# Patient Record
Sex: Male | Born: 1960 | Race: Black or African American | Hispanic: No | Marital: Married | Smoking: Never smoker
Health system: Southern US, Community
[De-identification: ages and names within clinical notes are randomized; demographics above are authoritative.]

## PROBLEM LIST (undated history)

## (undated) DIAGNOSIS — D72819 Decreased white blood cell count, unspecified: Secondary | ICD-10-CM

## (undated) DIAGNOSIS — I10 Essential (primary) hypertension: Secondary | ICD-10-CM

## (undated) DIAGNOSIS — I2699 Other pulmonary embolism without acute cor pulmonale: Secondary | ICD-10-CM

## (undated) DIAGNOSIS — IMO0002 Reserved for concepts with insufficient information to code with codable children: Secondary | ICD-10-CM

## (undated) HISTORY — DX: Decreased white blood cell count, unspecified: D72.819

## (undated) HISTORY — DX: Reserved for concepts with insufficient information to code with codable children: IMO0002

## (undated) HISTORY — DX: Essential (primary) hypertension: I10

## (undated) HISTORY — PX: COLONOSCOPY: SHX174

## (undated) HISTORY — PX: NO PAST SURGERIES: SHX2092

---

## 2000-11-23 ENCOUNTER — Emergency Department (HOSPITAL_COMMUNITY): Admission: EM | Admit: 2000-11-23 | Discharge: 2000-11-23 | Payer: Self-pay | Admitting: Emergency Medicine

## 2003-09-11 ENCOUNTER — Inpatient Hospital Stay (HOSPITAL_COMMUNITY): Admission: EM | Admit: 2003-09-11 | Discharge: 2003-09-15 | Payer: Self-pay | Admitting: Emergency Medicine

## 2003-09-11 DIAGNOSIS — I2699 Other pulmonary embolism without acute cor pulmonale: Secondary | ICD-10-CM

## 2003-09-11 HISTORY — DX: Other pulmonary embolism without acute cor pulmonale: I26.99

## 2003-10-12 ENCOUNTER — Ambulatory Visit (HOSPITAL_COMMUNITY): Admission: RE | Admit: 2003-10-12 | Discharge: 2003-10-12 | Payer: Self-pay | Admitting: Family Medicine

## 2004-04-03 ENCOUNTER — Ambulatory Visit: Admission: RE | Admit: 2004-04-03 | Discharge: 2004-04-03 | Payer: Self-pay | Admitting: Family Medicine

## 2004-04-19 ENCOUNTER — Encounter: Admission: RE | Admit: 2004-04-19 | Discharge: 2004-04-19 | Payer: Self-pay | Admitting: General Surgery

## 2005-01-23 ENCOUNTER — Ambulatory Visit: Payer: Self-pay | Admitting: Internal Medicine

## 2005-02-28 ENCOUNTER — Encounter: Payer: Self-pay | Admitting: Internal Medicine

## 2005-02-28 ENCOUNTER — Encounter (INDEPENDENT_AMBULATORY_CARE_PROVIDER_SITE_OTHER): Payer: Self-pay | Admitting: *Deleted

## 2005-02-28 ENCOUNTER — Ambulatory Visit (HOSPITAL_COMMUNITY): Admission: RE | Admit: 2005-02-28 | Discharge: 2005-02-28 | Payer: Self-pay | Admitting: Internal Medicine

## 2005-02-28 ENCOUNTER — Ambulatory Visit: Payer: Self-pay | Admitting: Internal Medicine

## 2006-06-15 ENCOUNTER — Ambulatory Visit: Payer: Self-pay | Admitting: Family Medicine

## 2006-06-22 ENCOUNTER — Ambulatory Visit: Payer: Self-pay | Admitting: Family Medicine

## 2007-11-29 ENCOUNTER — Ambulatory Visit: Payer: Self-pay | Admitting: Family Medicine

## 2007-12-06 ENCOUNTER — Ambulatory Visit: Payer: Self-pay | Admitting: Family Medicine

## 2008-06-20 ENCOUNTER — Ambulatory Visit: Payer: Self-pay | Admitting: Family Medicine

## 2008-06-20 ENCOUNTER — Encounter: Admission: RE | Admit: 2008-06-20 | Discharge: 2008-06-20 | Payer: Self-pay | Admitting: Family Medicine

## 2008-07-02 ENCOUNTER — Encounter: Admission: RE | Admit: 2008-07-02 | Discharge: 2008-07-02 | Payer: Self-pay | Admitting: Family Medicine

## 2008-07-17 ENCOUNTER — Ambulatory Visit: Payer: Self-pay | Admitting: Family Medicine

## 2008-09-26 ENCOUNTER — Ambulatory Visit: Payer: Self-pay | Admitting: Family Medicine

## 2008-10-30 ENCOUNTER — Ambulatory Visit: Payer: Self-pay | Admitting: Family Medicine

## 2009-02-08 ENCOUNTER — Emergency Department (HOSPITAL_COMMUNITY): Admission: EM | Admit: 2009-02-08 | Discharge: 2009-02-08 | Payer: Self-pay | Admitting: Family Medicine

## 2009-07-04 ENCOUNTER — Ambulatory Visit: Payer: Self-pay | Admitting: Family Medicine

## 2009-10-01 ENCOUNTER — Ambulatory Visit: Payer: Self-pay | Admitting: Family Medicine

## 2009-10-04 ENCOUNTER — Ambulatory Visit: Payer: Self-pay | Admitting: Family Medicine

## 2009-10-08 ENCOUNTER — Ambulatory Visit: Payer: Self-pay | Admitting: Family Medicine

## 2010-06-08 ENCOUNTER — Encounter: Payer: Self-pay | Admitting: General Surgery

## 2010-07-22 ENCOUNTER — Encounter (INDEPENDENT_AMBULATORY_CARE_PROVIDER_SITE_OTHER): Payer: BC Managed Care – PPO | Admitting: Family Medicine

## 2010-07-22 DIAGNOSIS — I10 Essential (primary) hypertension: Secondary | ICD-10-CM

## 2010-07-22 DIAGNOSIS — Z79899 Other long term (current) drug therapy: Secondary | ICD-10-CM

## 2010-08-15 ENCOUNTER — Ambulatory Visit: Payer: Self-pay | Admitting: Family Medicine

## 2010-10-04 NOTE — H&P (Signed)
NAME:  BRYCEN, BEAN NO.:  [id number]   MEDICAL RECORD NO.:  [id number]                   PATIENT TYPE:  INP   LOCATION:  0162                                 FACILITY:  [facility]   PHYSICIAN:  Kela Millin, M.D.             DATE OF BIRTH:  November 26, 1960   DATE OF ADMISSION:  09/11/2003  DATE OF DISCHARGE:                                HISTORY & PHYSICAL   PRIMARY CARE PHYSICIAN:  Vikki Ports, M.D.   CHIEF COMPLAINT:  Persistent pleuritic chest pain and pulmonary embolus, per  CT scan.   HISTORY OF PRESENT ILLNESS:  The patient is a 50 year old black male who  presented with persistent pleuritic chest pain at the clinic on the day of  admission.  On further evaluation by Dr. Theresia Lo, a D-dimer was elevated,  and a CT scan revealed pulmonary embolus.  The patient was sent to the ER  for admission to the hospitalist service for further evaluation and  management.   The patient reports that five days ago, he went to the clinic with  complaints of fevers, chills, right-sided back pain, and sore throat.  Per  Dr. Theresia Lo, he was found to have strep throat.  Also, a chest x-ray done  at the time revealed a right lower lobe atelectasis or pneumonia.  The  patient was started on Biaxin.  On followup today, he continued to have a  pleuritic chest pain.  A D-dimer was done at the office, and it was  elevated.  The patient was sent for a CT scan at [facility], and it  revealed a pulmonary embolus and right basilar consolidation.  In the ER,  the patient reported pleuritic chest pain, worse with breathing, coughing,  sneezing, and as a result, he stated he tried to take shallow breaths to  minimize the pain.  He states that since starting antibiotics, the sore  throat and fevers resolved.  He denies a cough, palpitations, and dizziness.  The patient also denies any long distance travel as well as any surgeries.  He denies leg pain and swelling.   Also, no hemoptysis.  No hematemesis.  No  nausea or vomiting.  Also, no dysuria and no hematuria.   PAST MEDICAL HISTORY:  As above.  Otherwise negative.   MEDICATIONS:  1. Biaxin.  2. Hydrocodone/acetaminophen.   ALLERGIES:  NKDA.   SOCIAL HISTORY:  Denies tobacco.  Also denies alcohol and illegal drug use.  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   FAMILY HISTORY:  Mother has diabetes mellitus.   REVIEW OF SYSTEMS:  As per HPI.  Other comprehensive review of systems  negative.   PHYSICAL EXAMINATION:  VITAL SIGNS:  Temperature 97.1, blood pressure  155/98.  Pulse 93.  Respiratory rate 16.  O2 sats 100%.  GENERAL:  Patient is a middle-aged black male in no acute distress.  Well-  developed and well-nourished.  HEENT:  PERRL.  EOMI.  Sclerae are anicteric.  Moist mucous membranes.  No  oral exudates.  No plaques.  NECK:  Supple.  No adenopathy.  No thyromegaly.  No JVD.  LUNGS:  Decreased breath sounds in the right lung base with a few crackles.  No wheezes.  HEART:  Regular rate and rhythm.  Normal S1 and S2.  No S3 or S4  appreciated.  ABDOMEN:  Soft.  Bowel sounds present.  Nontender.  Nondistended.  No  organomegaly.  No masses palpable.  EXTREMITIES:  No clubbing, cyanosis or edema.  No calf tenderness.  No  Homans' sign.  NEUROLOGIC:  Alert and oriented x 3.  Cranial nerves II-XII grossly intact.  Strength 5/5.  Sensory grossly normal.  Nonfocal exam.   LABS/STUDIES:  CT chest:  Right posterior basal segmental pulmonary embolus  with right base consolidation at [facility].   D-dimer is 2.01.   ASSESSMENT/PLAN:  1. Pulmonary embolus:  Middle-aged black male with pulmonary embolus on CT     scan.  Will obtain hypercoagulable workup and start on Lovenox per the     pharmacy protocol and start Coumadin later.  Will also obtain lower     extremity Dopplers, follow, and further workup as clinically appropriate.  2.  Pneumonia:  Right lower lobe.  Will start empiric IV antibiotics, blood     cultures, chest x-rays, CBC, and CMET pending at this time.                                               Kela Millin, M.D.    ACV/MEDQ  D:  09/12/2003  T:  09/12/2003  Job:  [id number]

## 2010-10-04 NOTE — Discharge Summary (Signed)
NAME:  Cameron, LIFE NO.:  [id number]   MEDICAL RECORD NO.:  [id number]                   PATIENT TYPE:  INP   LOCATION:  0352                                 FACILITY:  [facility]   PHYSICIAN:  [name], MD                   DATE OF BIRTH:  [birth year]   DATE OF ADMISSION:  09/11/2003  DATE OF DISCHARGE:  09/15/2003                                 DISCHARGE SUMMARY   Cameron Schmitt is a 50 year old man who had presented to Cameron Schmitt,  M.D., on April 25 with complaints of persistent pleuritic chest discomfort.  Dr. Theresia Schmitt did a D-dimer assay and found it to be elevated.  The patient  was sent to [facility], where a CT scan of the chest was done, which  apparently confirmed the presence of a pulmonary embolus.  The patient  therefore sent to the [facility] emergency room for admission.  The patient  was seen in the emergency room by Dr. Suanne Schmitt.  In the emergency room he  complained of pleuritic chest discomfort, worse with taking a deep breath,  coughing, or sneezing.  He denied any recent history of travel or sedentary  state.  He had had no recent surgery.  He had had no recent leg pain or  swelling.  He denied hemoptysis hematemesis, nausea or vomiting.  He had  been taking Biaxin because of a previous diagnosis of bronchitis.   The patient's social history was remarkable in that he does not smoke, he  does not use alcohol or drugs.  He is very physically active, engaging in  karate and weightlifting on a daily basis.   PHYSICAL EXAMINATION:  GENERAL:  A gentleman who appeared to be in excellent  health.  VITAL SIGNS:  His temperature was 97.1, blood pressure was 155/98, pulse was  93, respirations 16, O2 saturation was 100%.  HEENT:  Within normal limits.  CHEST:  Said to show decreased breath sounds at the right base.  CARDIOVASCULAR:  Normal S1, S2, without rubs, murmurs, or gallops.  ABDOMEN:  Benign.  NEUROLOGIC:  Within normal  limits.  EXTREMITIES:  No evidence of cyanosis or edema.   Dr. Suanne Schmitt got a verbal report on the chest CT.  It was reported to show a  right posterior basal segment pulmonary embolus with right basal  consolidation consistent with atelectasis.  The patient's D-dimer was 2.01.  Therefore, the patient was admitted to [facility].  He was given  normal saline at 75 mL/hr.  Lovenox was begun 1 mg/kg subcu every 12 hours.  The patient was begun on Coumadin on the same day.  He was also placed on  Rocephin and Zithromax because of concern about possible underlying  bronchitis or pneumonia.  A hypercoagulability panel was obtained.  This was  entirely normal in regard to factor V Leiden levels, protein C, and protein  S.  A lower extremity Doppler ultrasound was obtained.  I do not have a  printed report on the result of this test.  By April 27 the patient was up  and ambulating.  Antibiotics were discontinued as it appeared clear from  review of x-rays that the patient's main problem was the pulmonary embolus.  He tolerated the Lovenox and Coumadin well.  Pleuritic chest pain resolved.  By April 29 the INR was up to 2.0.  It was felt reasonable to proceed with  discharge.   DISCHARGE DIAGNOSES:  1. Pulmonary embolus.  2. History of bronchitis.   On discharge the patient was advised to take Coumadin 7.5 mg Friday, 5 mg  Saturday, 5 mg Sunday, and then to return to Dr. Tenny Schmitt' office on Monday.  He  apparently already has an appointment.  At that time his prothrombin should  be repeated and he should be given additional advice about his Coumadin  therapy.  I instructed Mr. Leitz not to resume his karate and weightlifting  activities until he has seen Dr. Tenny Schmitt back on Monday and his general  condition had been assessed.  I encouraged him in general to engage in  regular physical exercise.   The patient's condition at the time of discharge was good.                                                [name], MD    SY/MEDQ  D:  09/15/2003  T:  09/15/2003  Job:  [id number]   cc:   C. Duane Lope, M.D.  [address]  [place]  [place] [address]  Fax: [phone]

## 2011-08-27 ENCOUNTER — Other Ambulatory Visit: Payer: Self-pay | Admitting: Family Medicine

## 2011-08-27 NOTE — Telephone Encounter (Signed)
Pt needs appt for further refills. 

## 2011-09-03 ENCOUNTER — Encounter: Payer: Self-pay | Admitting: Family Medicine

## 2011-09-03 ENCOUNTER — Ambulatory Visit (INDEPENDENT_AMBULATORY_CARE_PROVIDER_SITE_OTHER): Payer: BC Managed Care – PPO | Admitting: Family Medicine

## 2011-09-03 VITALS — BP 130/88 | HR 82 | Ht 68.0 in | Wt 150.0 lb

## 2011-09-03 DIAGNOSIS — D709 Neutropenia, unspecified: Secondary | ICD-10-CM | POA: Insufficient documentation

## 2011-09-03 DIAGNOSIS — Z0000 Encounter for general adult medical examination without abnormal findings: Secondary | ICD-10-CM

## 2011-09-03 DIAGNOSIS — I10 Essential (primary) hypertension: Secondary | ICD-10-CM

## 2011-09-03 LAB — CBC WITH DIFFERENTIAL/PLATELET
Basophils Absolute: 0 10*3/uL (ref 0.0–0.1)
Basophils Relative: 1 % (ref 0–1)
Eosinophils Relative: 3 % (ref 0–5)
HCT: 44.2 % (ref 39.0–52.0)
Hemoglobin: 14.3 g/dL (ref 13.0–17.0)
Lymphocytes Relative: 33 % (ref 12–46)
Lymphs Abs: 0.8 10*3/uL (ref 0.7–4.0)
MCH: 26.2 pg (ref 26.0–34.0)
MCHC: 32.4 g/dL (ref 30.0–36.0)
MCV: 81 fL (ref 78.0–100.0)
Monocytes Absolute: 0.3 10*3/uL (ref 0.1–1.0)
Monocytes Relative: 14 % — ABNORMAL HIGH (ref 3–12)
Neutro Abs: 1.1 10*3/uL — ABNORMAL LOW (ref 1.7–7.7)
Neutrophils Relative %: 49 % (ref 43–77)
Platelets: 267 10*3/uL (ref 150–400)
RBC: 5.46 MIL/uL (ref 4.22–5.81)
RDW: 13.7 % (ref 11.5–15.5)
WBC: 2.3 10*3/uL — ABNORMAL LOW (ref 4.0–10.5)

## 2011-09-03 LAB — COMPREHENSIVE METABOLIC PANEL
ALT: 185 U/L — ABNORMAL HIGH (ref 0–53)
AST: 303 U/L — ABNORMAL HIGH (ref 0–37)
Albumin: 4.5 g/dL (ref 3.5–5.2)
BUN: 18 mg/dL (ref 6–23)
CO2: 32 mEq/L (ref 19–32)
Calcium: 9.8 mg/dL (ref 8.4–10.5)
Chloride: 95 mEq/L — ABNORMAL LOW (ref 96–112)
Creat: 1.01 mg/dL (ref 0.50–1.35)
Potassium: 4 mEq/L (ref 3.5–5.3)
Sodium: 135 mEq/L (ref 135–145)
Total Protein: 7.9 g/dL (ref 6.0–8.3)

## 2011-09-03 LAB — LIPID PANEL
Cholesterol: 200 mg/dL (ref 0–200)
HDL: 41 mg/dL (ref >39)
LDL Cholesterol: 149 mg/dL — ABNORMAL HIGH (ref 0–99)
Triglycerides: 49 mg/dL (ref <150)

## 2011-09-03 MED ORDER — LISINOPRIL-HYDROCHLOROTHIAZIDE 20-12.5 MG PO TABS: 2.0000 | ORAL_TABLET | Freq: Every day | ORAL | Status: DC

## 2011-09-03 MED ORDER — AMLODIPINE BESYLATE 5 MG PO TABS: 5.0000 mg | ORAL_TABLET | Freq: Every day | ORAL | Status: DC

## 2011-09-03 NOTE — Progress Notes (Signed)
  Subjective:    Patient ID: Cameron Schmitt, male    DOB: 05-29-1960, 51 y.o.   MRN: [id number]  HPI He is here for a complete examination. He has no particular concerns or complaints. He continues on his blood pressure medications. He exercises regularly. He does have a previous history of neutropenia. His work is going well. He is dating someone at this time. He does have 2 children both in their early 45s. There is a questionable family history of cancer with a brother dying before age 94 however he is not sure exactly what kind of cancer this was. He exercises regularly with weights and aerobics.   Review of Systems  Constitutional: Negative.   HENT: Negative.   Eyes: Negative.   Respiratory: Negative.   Cardiovascular: Negative.   Gastrointestinal: Negative.   Genitourinary: Negative.   Musculoskeletal: Negative.   Skin: Negative.   Neurological: Negative.   Hematological: Negative.   Psychiatric/Behavioral: Negative.        Objective:   Physical Exam BP 130/88  Pulse 82  Ht 5\' 8"  (1.727 m)  Wt 150 lb (68.04 kg)  BMI 22.81 kg/m2  General Appearance:    Alert, cooperative, no distress, appears stated age  Head:    Normocephalic, without obvious abnormality, atraumatic  Eyes:    PERRL, conjunctiva/corneas clear, EOM's intact, fundi    benign  Ears:    Normal TM's and external ear canals  Nose:   Nares normal, mucosa normal, no drainage or sinus   tenderness  Throat:   Lips, mucosa, and tongue normal; teeth and gums normal  Neck:   Supple, no lymphadenopathy;  thyroid:  no   enlargement/tenderness/nodules; no carotid   bruit or JVD  Back:    Spine nontender, no curvature, ROM normal, no CVA     tenderness  Lungs:     Clear to auscultation bilaterally without wheezes, rales or     ronchi; respirations unlabored  Chest Wall:    No tenderness or deformity   Heart:    Regular rate and rhythm, S1 and S2 normal, no murmur, rub   or gallop  Breast Exam:    No chest wall  tenderness, masses or gynecomastia  Abdomen:     Soft, non-tender, nondistended, normoactive bowel sounds,    no masses, no hepatosplenomegaly  Genitalia:   deferred   Rectal:   deferred  Extremities:   No clubbing, cyanosis or edema  Pulses:   2+ and symmetric all extremities  Skin:   Skin color, texture, turgor normal, no rashes or lesions  Lymph nodes:   Cervical, supraclavicular, and axillary nodes normal  Neurologic:   CNII-XII intact, normal strength, sensation and gait; reflexes 2+ and symmetric throughout          Psych:   Normal mood, affect, hygiene and grooming.           Assessment & Plan:   1. Routine general medical examination at a health care facility  HM COLONOSCOPY, PSA, CBC with Differential, Comprehensive metabolic panel, Lipid panel  2. Neutropenia  CBC with Differential  3. Hypertension  amLODipine (NORVASC) 5 MG tablet, lisinopril-hydrochlorothiazide (ZESTORETIC) 20-12.5 MG per tablet  Encouraged him to continue with his very active lifestyle.

## 2011-09-04 LAB — PSA: PSA: 1.35 ng/mL (ref <=4.00)

## 2011-09-04 NOTE — Progress Notes (Signed)
Pt coming in tomorrow for lab consult

## 2011-09-05 ENCOUNTER — Ambulatory Visit (INDEPENDENT_AMBULATORY_CARE_PROVIDER_SITE_OTHER): Payer: BC Managed Care – PPO | Admitting: Family Medicine

## 2011-09-05 DIAGNOSIS — K759 Inflammatory liver disease, unspecified: Secondary | ICD-10-CM

## 2011-09-05 LAB — HEPATITIS C ANTIBODY: HCV Ab: NEGATIVE

## 2011-09-05 LAB — HEPATITIS B SURFACE ANTIBODY,QUALITATIVE: Hep B S Ab: NEGATIVE

## 2011-09-05 NOTE — Progress Notes (Signed)
  Subjective:    Patient ID: Cameron Schmitt, male    DOB: June 21, 1960, 51 y.o.   MRN: [id number]  HPI He is here for recheck. Recent blood work did show elevated liver enzymes. He has not been sick recently. He does take multivitamin supplements as well as protein supplements. He does not do street drugs.  Review of Systems     Objective:   Physical Exam Alert and in no distress otherwise not examined       Assessment & Plan:   1. Hepatitis  Hepatitis A antibody, total, Hepatitis B surface antibody, Hepatitis C antibody   I will have him stop the multivitamin supplements and protein supplements indicating to him that this might or might not be one of the issues. He is also to avoid all alcohol.

## 2011-09-06 LAB — HEPATITIS A ANTIBODY, TOTAL: Hep A Total Ab: NEGATIVE

## 2011-09-08 ENCOUNTER — Other Ambulatory Visit: Payer: BC Managed Care – PPO

## 2011-09-08 ENCOUNTER — Other Ambulatory Visit: Payer: Self-pay

## 2011-09-08 DIAGNOSIS — R748 Abnormal levels of other serum enzymes: Secondary | ICD-10-CM

## 2011-09-11 ENCOUNTER — Other Ambulatory Visit: Payer: BC Managed Care – PPO

## 2011-09-11 DIAGNOSIS — R748 Abnormal levels of other serum enzymes: Secondary | ICD-10-CM

## 2011-09-11 LAB — COMPREHENSIVE METABOLIC PANEL
ALT: 57 U/L — ABNORMAL HIGH (ref 0–53)
AST: 43 U/L — ABNORMAL HIGH (ref 0–37)
Albumin: 4.6 g/dL (ref 3.5–5.2)
BUN: 20 mg/dL (ref 6–23)
CO2: 29 mEq/L (ref 19–32)
Calcium: 9.1 mg/dL (ref 8.4–10.5)
Chloride: 98 mEq/L (ref 96–112)
Creat: 1.27 mg/dL (ref 0.50–1.35)
Glucose, Bld: 109 mg/dL — ABNORMAL HIGH (ref 70–99)
Potassium: 3.5 mEq/L (ref 3.5–5.3)
Sodium: 136 mEq/L (ref 135–145)
Total Bilirubin: 0.6 mg/dL (ref 0.3–1.2)
Total Protein: 7.4 g/dL (ref 6.0–8.3)

## 2011-10-09 LAB — HM COLONOSCOPY: HM Colonoscopy: NORMAL

## 2011-10-14 ENCOUNTER — Encounter: Payer: Self-pay | Admitting: Internal Medicine

## 2012-09-14 ENCOUNTER — Other Ambulatory Visit: Payer: Self-pay | Admitting: Family Medicine

## 2012-09-17 ENCOUNTER — Other Ambulatory Visit: Payer: Self-pay

## 2012-09-17 MED ORDER — LISINOPRIL-HYDROCHLOROTHIAZIDE 20-12.5 MG PO TABS: ORAL_TABLET | ORAL | Status: DC

## 2012-09-17 MED ORDER — AMLODIPINE BESYLATE 5 MG PO TABS: ORAL_TABLET | ORAL | Status: DC

## 2012-09-17 NOTE — Telephone Encounter (Signed)
PT WANTED MED SENT TO [facility] NOT [facility]

## 2012-09-30 ENCOUNTER — Encounter: Payer: Self-pay | Admitting: Internal Medicine

## 2012-10-15 ENCOUNTER — Encounter: Payer: Self-pay | Admitting: Family Medicine

## 2012-10-15 ENCOUNTER — Ambulatory Visit (INDEPENDENT_AMBULATORY_CARE_PROVIDER_SITE_OTHER): Payer: 59 | Admitting: Family Medicine

## 2012-10-15 VITALS — BP 126/84 | HR 82 | Ht 67.5 in | Wt 149.0 lb

## 2012-10-15 DIAGNOSIS — D709 Neutropenia, unspecified: Secondary | ICD-10-CM

## 2012-10-15 DIAGNOSIS — I10 Essential (primary) hypertension: Secondary | ICD-10-CM

## 2012-10-15 DIAGNOSIS — Z0000 Encounter for general adult medical examination without abnormal findings: Secondary | ICD-10-CM

## 2012-10-15 DIAGNOSIS — M25569 Pain in unspecified knee: Secondary | ICD-10-CM

## 2012-10-15 DIAGNOSIS — M25561 Pain in right knee: Secondary | ICD-10-CM

## 2012-10-15 DIAGNOSIS — Z79899 Other long term (current) drug therapy: Secondary | ICD-10-CM

## 2012-10-15 LAB — CBC WITH DIFFERENTIAL/PLATELET
Basophils Absolute: 0 10*3/uL (ref 0.0–0.1)
Eosinophils Absolute: 0 10*3/uL (ref 0.0–0.7)
Hemoglobin: 14.4 g/dL (ref 13.0–17.0)
Lymphocytes Relative: 22 % (ref 12–46)
Lymphs Abs: 0.7 10*3/uL (ref 0.7–4.0)
MCH: 26.2 pg (ref 26.0–34.0)
MCHC: 33.1 g/dL (ref 30.0–36.0)
MCV: 79.1 fL (ref 78.0–100.0)
Monocytes Absolute: 0.4 10*3/uL (ref 0.1–1.0)
Monocytes Relative: 11 % (ref 3–12)
Neutro Abs: 2.3 10*3/uL (ref 1.7–7.7)
Neutrophils Relative %: 66 % (ref 43–77)
Platelets: 243 10*3/uL (ref 150–400)
RBC: 5.5 MIL/uL (ref 4.22–5.81)
WBC: 3.4 10*3/uL — ABNORMAL LOW (ref 4.0–10.5)

## 2012-10-15 LAB — POCT URINALYSIS DIPSTICK
Bilirubin, UA: NEGATIVE
Glucose, UA: NEGATIVE
Ketones, UA: NEGATIVE
Leukocytes, UA: NEGATIVE
Nitrite, UA: NEGATIVE
Spec Grav, UA: 1.01
Urobilinogen, UA: NEGATIVE

## 2012-10-15 MED ORDER — LISINOPRIL-HYDROCHLOROTHIAZIDE 20-12.5 MG PO TABS: ORAL_TABLET | ORAL | Status: DC

## 2012-10-15 MED ORDER — AMLODIPINE BESYLATE 5 MG PO TABS: ORAL_TABLET | ORAL | Status: DC

## 2012-10-15 NOTE — Patient Instructions (Signed)
If you're knee pain gives you more trouble, return here for further evaluation.

## 2012-10-15 NOTE — Progress Notes (Signed)
  Subjective:    Patient ID: Cameron Schmitt, male    DOB: 09/10/1960, 52 y.o.   MRN: [id number]  HPI He is here for complete examination. He does complain of some right knee discomfort. He states that he can squat without difficulty but occasionally when he walks he will have pain which makes him feel weak in that leg. No popping, locking or grinding. No history of injury.   Review of Systems  Constitutional: Negative.   HENT: Negative.   Eyes: Negative.   Respiratory: Negative.   Cardiovascular: Negative.   Gastrointestinal: Negative.   Endocrine: Negative.   Genitourinary: Negative.   Allergic/Immunologic: Negative.   Neurological: Negative.   Hematological: Negative.   Psychiatric/Behavioral: Negative.        Objective:   Physical Exam BP 126/84  Pulse 82  Ht 5' 7.5" (1.715 m)  Wt 149 lb (67.586 kg)  BMI 22.98 kg/m2  General Appearance:    Alert, cooperative, no distress, appears stated age  Head:    Normocephalic, without obvious abnormality, atraumatic  Eyes:    PERRL, conjunctiva/corneas clear, EOM's intact.  Ears:    Normal TM's and external ear canals  Nose:   Nares normal, mucosa normal, no drainage or sinus   tenderness  Throat:   Lips, mucosa, and tongue normal; teeth and gums normal  Neck:   Supple, no lymphadenopathy;  thyroid:  no   enlargement/tenderness/nodules; no carotid   bruit or JVD  Back:    Spine nontender, no curvature, ROM normal, no CVA     tenderness  Lungs:     Clear to auscultation bilaterally without wheezes, rales or     ronchi; respirations unlabored  Chest Wall:    No tenderness or deformity   Heart:    Regular rate and rhythm, S1 and S2 normal, no murmur, rub   or gallop  Breast Exam:    No chest wall tenderness, masses or gynecomastia  Abdomen:     Soft, non-tender, nondistended, normoactive bowel sounds,    no masses, no hepatosplenomegaly  Genitalia:    Normal male external genitalia without lesions.  Testicles without masses.  No  inguinal hernias.  Rectal:    Normal sphincter tone, no masses or tenderness; guaiac negative stool.  Prostate smooth, no nodules, not enlarged.  Extremities:   No clubbing, cyanosis or edema.left knee shows no effusion, palpable tenderness. Ligaments intact. Negative anterior drawer and McMurray's testing.  Pulses:   2+ and symmetric all extremities  Skin:   Skin color, texture, turgor normal, no rashes or lesions  Lymph nodes:   Cervical, supraclavicular, and axillary nodes normal  Neurologic:   CNII-XII intact, normal strength, sensation and gait; reflexes 2+ and symmetric throughout          Psych:   Normal mood, affect, hygiene and grooming.          Assessment & Plan:  Routine general medical examination at a health care facility - Plan: CBC with Differential, Comprehensive metabolic panel, Lipid panel  Hypertension - Plan: POCT urinalysis dipstick, CBC with Differential, Comprehensive metabolic panel, lisinopril-hydrochlorothiazide (PRINZIDE,ZESTORETIC) 20-12.5 MG per tablet, amLODipine (NORVASC) 5 MG tablet  Neutropenia - Plan: CBC with Differential  Right knee pain  Encounter for long-term (current) use of other medications  Instructed to return if the knee pain causesmore trouble

## 2012-10-16 LAB — COMPREHENSIVE METABOLIC PANEL
ALT: 30 U/L (ref 0–53)
AST: 33 U/L (ref 0–37)
Albumin: 4.6 g/dL (ref 3.5–5.2)
Alkaline Phosphatase: 59 U/L (ref 39–117)
BUN: 13 mg/dL (ref 6–23)
CO2: 26 mEq/L (ref 19–32)
Creat: 1.01 mg/dL (ref 0.50–1.35)
Glucose, Bld: 77 mg/dL (ref 70–99)
Sodium: 138 mEq/L (ref 135–145)
Total Bilirubin: 0.9 mg/dL (ref 0.3–1.2)
Total Protein: 8.1 g/dL (ref 6.0–8.3)

## 2012-10-16 LAB — LIPID PANEL
Cholesterol: 178 mg/dL (ref 0–200)
HDL: 45 mg/dL (ref >39)
LDL Cholesterol: 123 mg/dL — ABNORMAL HIGH (ref 0–99)
Total CHOL/HDL Ratio: 4 Ratio
Triglycerides: 50 mg/dL (ref <150)
VLDL: 10 mg/dL (ref 0–40)

## 2012-10-16 NOTE — Progress Notes (Signed)
Quick Note:  The blood work is normal ______ 

## 2012-10-18 NOTE — Progress Notes (Signed)
Quick Note:  CALLED PT CELL/HOME # LEFT MESSAGE FOR PT WORD FOR WORD The blood work is normal ______

## 2013-01-04 ENCOUNTER — Encounter: Payer: Self-pay | Admitting: Family Medicine

## 2013-01-04 ENCOUNTER — Ambulatory Visit (INDEPENDENT_AMBULATORY_CARE_PROVIDER_SITE_OTHER): Payer: 59 | Admitting: Family Medicine

## 2013-01-04 VITALS — BP 120/78 | HR 78 | Wt 150.0 lb

## 2013-01-04 DIAGNOSIS — M5090 Cervical disc disorder, unspecified, unspecified cervical region: Secondary | ICD-10-CM

## 2013-01-04 NOTE — Progress Notes (Signed)
  Subjective:    Patient ID: Cameron Schmitt, male    DOB: 1961/03/23, 52 y.o.   MRN: [id number]  HPI Approximately 3 weeks ago he noted the onset of right-sided neck pain especially when he would extend his head. He would get tingling in the neck shoulder and into the arms down into the thumb index and large fingers. No history of recent injury. Review of the record indicates he did have a similar problem 2010. The MRI did show some small disc herniations. Review of Systems     Objective:   Physical Exam Compression of the head in an axial load did cause pain with tingling into his arms. Flexion in the right lateral direction also calls tingling sensation in the thumb and index finger. Motor ,sensory and DTRs are normal.       Assessment & Plan:  Cervical neck pain with evidence of disc disease - Plan: MR Cervical Spine Wo Contrast

## 2013-01-08 ENCOUNTER — Ambulatory Visit
Admission: RE | Admit: 2013-01-08 | Discharge: 2013-01-08 | Disposition: A | Discharge status: Home / Self Care | Payer: 59 | Source: Ambulatory Visit | Attending: Family Medicine | Admitting: Family Medicine

## 2013-01-08 DIAGNOSIS — M5090 Cervical disc disorder, unspecified, unspecified cervical region: Secondary | ICD-10-CM

## 2013-01-12 ENCOUNTER — Telehealth: Payer: Self-pay | Admitting: Family Medicine

## 2013-01-12 NOTE — Telephone Encounter (Signed)
MRI shows findings of disc disease that could be contributing to his pain, but the MRI doesn't seem to be a lot different than findings in 2010.  Not sure what he and Dr. Susann Givens discussed as next steps, but he can either wait til Dr. Susann Givens gets back next week to discuss options, or one option may be to consider referral for epidural steroid injection in the C spine if his pain and symptoms are severe.

## 2013-01-13 ENCOUNTER — Telehealth: Payer: Self-pay | Admitting: *Deleted

## 2013-01-13 NOTE — Telephone Encounter (Signed)
Spoke with patient and he would like to wait until Dr.Lalonde gets back and reviews MRI results and decides what next step is. I told him someone would be in touch early next week with Dr.Lalonde's recommendations.

## 2013-01-13 NOTE — Telephone Encounter (Signed)
See msg

## 2013-01-13 NOTE — Telephone Encounter (Signed)
Left message for patient to return my call.

## 2013-01-18 ENCOUNTER — Other Ambulatory Visit: Payer: Self-pay

## 2013-01-18 DIAGNOSIS — M549 Dorsalgia, unspecified: Secondary | ICD-10-CM

## 2013-01-18 NOTE — Telephone Encounter (Signed)
I left a message on his cell phone telling him on like to refer him to someone for consideration of epidural. Refer him to Dr. Ethelene Hal.

## 2013-01-18 NOTE — Progress Notes (Signed)
Quick Note:  LEFT MESSAGE FOR PT TO CALL ME BACK ______

## 2013-01-18 NOTE — Progress Notes (Signed)
Quick Note:  PT HAS BEEN INFORMED OF CHANGE OF PLANS TO SEND HIM TO [facility] PT VERBALIZED UNDERSTANDING ______

## 2013-03-24 ENCOUNTER — Other Ambulatory Visit: Payer: Self-pay

## 2013-11-16 ENCOUNTER — Other Ambulatory Visit: Payer: Self-pay | Admitting: Family Medicine

## 2013-11-16 NOTE — Telephone Encounter (Signed)
IS THIS OKAY 

## 2013-11-16 NOTE — Telephone Encounter (Signed)
I renewed his medication however he needs to set up an appointment for sometime in August

## 2014-02-07 ENCOUNTER — Other Ambulatory Visit: Payer: Self-pay | Admitting: Family Medicine

## 2014-10-23 ENCOUNTER — Emergency Department (HOSPITAL_COMMUNITY)
Admission: EM | Admit: 2014-10-23 | Discharge: 2014-10-24 | Disposition: A | Discharge status: Home / Self Care | Payer: PRIVATE HEALTH INSURANCE | Attending: Emergency Medicine | Admitting: Emergency Medicine

## 2014-10-23 ENCOUNTER — Encounter (HOSPITAL_COMMUNITY): Payer: Self-pay | Admitting: Emergency Medicine

## 2014-10-23 DIAGNOSIS — S39012A Strain of muscle, fascia and tendon of lower back, initial encounter: Secondary | ICD-10-CM | POA: Diagnosis not present

## 2014-10-23 DIAGNOSIS — Z79899 Other long term (current) drug therapy: Secondary | ICD-10-CM | POA: Diagnosis not present

## 2014-10-23 DIAGNOSIS — Y999 Unspecified external cause status: Secondary | ICD-10-CM | POA: Insufficient documentation

## 2014-10-23 DIAGNOSIS — Z872 Personal history of diseases of the skin and subcutaneous tissue: Secondary | ICD-10-CM | POA: Insufficient documentation

## 2014-10-23 DIAGNOSIS — Y929 Unspecified place or not applicable: Secondary | ICD-10-CM | POA: Insufficient documentation

## 2014-10-23 DIAGNOSIS — I10 Essential (primary) hypertension: Secondary | ICD-10-CM | POA: Diagnosis not present

## 2014-10-23 DIAGNOSIS — Y939 Activity, unspecified: Secondary | ICD-10-CM | POA: Insufficient documentation

## 2014-10-23 DIAGNOSIS — X58XXXA Exposure to other specified factors, initial encounter: Secondary | ICD-10-CM | POA: Insufficient documentation

## 2014-10-23 DIAGNOSIS — Z862 Personal history of diseases of the blood and blood-forming organs and certain disorders involving the immune mechanism: Secondary | ICD-10-CM | POA: Diagnosis not present

## 2014-10-23 DIAGNOSIS — S3992XA Unspecified injury of lower back, initial encounter: Secondary | ICD-10-CM | POA: Diagnosis present

## 2014-10-23 LAB — URINALYSIS, ROUTINE W REFLEX MICROSCOPIC
Bilirubin Urine: NEGATIVE
Glucose, UA: NEGATIVE mg/dL
Hgb urine dipstick: NEGATIVE
Ketones, ur: NEGATIVE mg/dL
LEUKOCYTES UA: NEGATIVE
NITRITE: NEGATIVE
PH: 7 (ref 5.0–8.0)
PROTEIN: NEGATIVE mg/dL
Specific Gravity, Urine: 1.027 (ref 1.005–1.030)
Urobilinogen, UA: 1 mg/dL (ref 0.0–1.0)

## 2014-10-23 NOTE — ED Provider Notes (Signed)
CSN: [id number]     Arrival date & time 10/23/14  2214 History  This chart was scribed for non-physician provider Jaynie Crumble, PA-C, working with Shon Baton, MD by Phillis Haggis, ED Scribe. This patient was seen in room [facility] and patient care was started at 11:55 PM.   Chief Complaint  Patient presents with  . Back Pain   The history is provided by the patient. No language interpreter was used.    HPI Comments: Cameron Schmitt is a 54 y.o. male who presents to the Emergency Department complaining of non-radiating constant, waxing and waning burning left lower back pain onset 2 weeks ago. He states that he does not know of any recent injury, states that he woke up with the pain. Pt reports taking Advil, Icy-Hot, and warm compresses for the pain with some relief before, but states that it is no longer working. Pt reports pain increases with twisting movements, walking and laying on his right side. Pt states that he does lifting at work. He denies numbness, weakness, nausea, vomiting, dizziness, headaches, diarrhea, constipation, or urinary symptoms. He denies having a PCP.   Past Medical History  Diagnosis Date  . Hypertension   . Inguinal cyst   . Leukopenia    History reviewed. No pertinent past surgical history. Family History  Problem Relation Age of Onset  . Diabetes Mother   . Hyperlipidemia Mother   . Hypertension Mother   . COPD Father    History  Substance Use Topics  . Smoking status: Never Smoker   . Smokeless tobacco: Never Used  . Alcohol Use: 0.6 oz/week    1 Cans of beer per week    Review of Systems  Constitutional: Negative for fever and chills.  Gastrointestinal: Negative for nausea, vomiting, diarrhea and constipation.  Genitourinary: Negative for dysuria, urgency and frequency.  Musculoskeletal: Positive for back pain.  Neurological: Negative for dizziness, weakness, numbness and headaches.   Allergies  Review of patient's allergies  indicates no known allergies.  Home Medications   Prior to Admission medications   Medication Sig Start Date End Date Taking? Authorizing Provider  amLODipine (NORVASC) 5 MG tablet take 1 tablet by mouth once daily 02/07/14   Ronnald Nian, MD  lisinopril-hydrochlorothiazide [facility]) 20-12.5 MG per tablet take 2 tablets by mouth once daily 02/07/14   Ronnald Nian, MD   BP 145/91 mmHg  Pulse 76  Temp(Src) 98.1 F (36.7 C) (Oral)  Resp 16  Ht  (1.727 m)  Wt 160 lb (72.576 kg)  BMI 24.33 kg/m2  SpO2 98%   Physical Exam  Constitutional: He is oriented to person, place, and time. He appears well-developed and well-nourished.  HENT:  Head: Normocephalic and atraumatic.  Eyes: EOM are normal.  Neck: Normal range of motion. Neck supple.  Cardiovascular: Normal rate.   Pulmonary/Chest: Effort normal.  Abdominal: Soft. Bowel sounds are normal. There is no tenderness.  Musculoskeletal: Normal range of motion.  Thoracic and lumbar spine nontender to palpation in midline. Tender to palpation over left paraspinal muscles around L4 level. Pain with forward flexion, twisting to the left. No pain with bilateral straight leg raise.  Neurological: He is alert and oriented to person, place, and time. No cranial nerve deficit. Coordination normal.  5/5 and equal lower extremity strength. 2+ and equal patellar reflexes bilaterally. Pt able to dorsiflex bilateral toes and feet with good strength against resistance. Equal sensation bilaterally over thighs and lower legs.    Skin: Skin  is warm and dry.  Psychiatric: He has a normal mood and affect. His behavior is normal.  Nursing note and vitals reviewed.   ED Course  Procedures (including critical care time) DIAGNOSTIC STUDIES: Oxygen Saturation is 98% on room air, normal by my interpretation.    COORDINATION OF CARE: 11:59 PM-Discussed treatment plan which includes muscle relaxants, back exercises, anti-inflammatories, pain  medication, heating pad, and no strenuous activity with pt at bedside and pt agreed to plan; told to follow up if necessary.   Labs Review Labs Reviewed  URINALYSIS, ROUTINE W REFLEX MICROSCOPIC [facility])   Imaging Review No results found.   EKG Interpretation None      MDM   Final diagnoses:  Lumbar strain, initial encounter     patient with nontraumatic pain in the left lower back. It does not radiate. No fever. No evidence of cauda equina. Urinalysis negative. Pain is not consistent with possible kidney stone, patient is comfortable appearing. Most likely muscular spasms versus strain. Pain is worsened with movement and palpation. Will start on Flexeril, tramadol for severe pain, naproxen. Follow up with primary care doctor.   Filed Vitals:   10/23/14 2243 10/24/14 0039  BP: 145/91 134/87  Pulse: 76 80  Temp: 98.1 F (36.7 C) 97.9 F (36.6 C)  TempSrc: Oral Oral  Resp: 16 18  Height: 5\' 8"  (1.727 m)   Weight: 160 lb (72.576 kg)   SpO2: 98% 100%  I personally performed the services described in this documentation, which was scribed in my presence. The recorded information has been reviewed and is accurate.     Jaynie Crumbleatyana Lya Holben, PA-C 10/24/14 0040  Shon Batonourtney F Horton, MD 10/24/14 [phone]

## 2014-10-23 NOTE — ED Notes (Signed)
Pt  C/o pain in left lower back, onset approx 4 days ago,  No known injury.  Pt does lifting at work

## 2014-10-24 MED ORDER — TRAMADOL HCL 50 MG PO TABS: 50.0000 mg | ORAL_TABLET | Freq: Four times a day (QID) | ORAL | Status: DC | PRN

## 2014-10-24 MED ORDER — CYCLOBENZAPRINE HCL 10 MG PO TABS: 10.0000 mg | ORAL_TABLET | Freq: Two times a day (BID) | ORAL | Status: DC | PRN

## 2014-10-24 MED ORDER — CYCLOBENZAPRINE HCL 10 MG PO TABS
10.0000 mg | ORAL_TABLET | Freq: Once | ORAL | Status: AC
  Administered 2014-10-24: 10 mg via ORAL
  Filled 2014-10-24: qty 1

## 2014-10-24 MED ORDER — NAPROXEN 500 MG PO TABS: 500.0000 mg | ORAL_TABLET | Freq: Two times a day (BID) | ORAL | Status: DC

## 2014-10-24 MED ORDER — OXYCODONE-ACETAMINOPHEN 5-325 MG PO TABS
1.0000 | ORAL_TABLET | Freq: Once | ORAL | Status: AC
  Administered 2014-10-24: 1 via ORAL
  Filled 2014-10-24: qty 1

## 2014-10-24 NOTE — Discharge Instructions (Signed)
Naproxen for pain and inflammation daily. Tramadol for severe pain. Flexeril for muscle spasms. Avoid any heavy lifting. Try heating pads, stretches, see exercise given below. Follow-up with primary care doctor.    Back Pain, Adult Low back pain is very common. About 1 in 5 people have back pain.The cause of low back pain is rarely dangerous. The pain often gets better over time.About half of people with a sudden onset of back pain feel better in just 2 weeks. About 8 in 10 people feel better by 6 weeks.  CAUSES Some common causes of back pain include:  Strain of the muscles or ligaments supporting the spine.  Wear and tear (degeneration) of the spinal discs.  Arthritis.  Direct injury to the back. DIAGNOSIS Most of the time, the direct cause of low back pain is not known.However, back pain can be treated effectively even when the exact cause of the pain is unknown.Answering your caregiver's questions about your overall health and symptoms is one of the most accurate ways to make sure the cause of your pain is not dangerous. If your caregiver needs more information, he or she may order lab work or imaging tests (X-rays or MRIs).However, even if imaging tests show changes in your back, this usually does not require surgery. HOME CARE INSTRUCTIONS For many people, back pain returns.Since low back pain is rarely dangerous, it is often a condition that people can learn to [facility] their own.   Remain active. It is stressful on the back to sit or stand in one place. Do not sit, drive, or stand in one place for more than 30 minutes at a time. Take short walks on level surfaces as soon as pain allows.Try to increase the length of time you walk each day.  Do not stay in bed.Resting more than 1 or 2 days can delay your recovery.  Do not avoid exercise or work.Your body is made to move.It is not dangerous to be active, even though your back may hurt.Your back will likely heal faster if you  return to being active before your pain is gone.  Pay attention to your body when you bend and lift. Many people have less discomfortwhen lifting if they bend their knees, keep the load close to their bodies,and avoid twisting. Often, the most comfortable positions are those that put less stress on your recovering back.  Find a comfortable position to sleep. Use a firm mattress and lie on your side with your knees slightly bent. If you lie on your back, put a pillow under your knees.  Only take over-the-counter or prescription medicines as directed by your caregiver. Over-the-counter medicines to reduce pain and inflammation are often the most helpful.Your caregiver may prescribe muscle relaxant drugs.These medicines help dull your pain so you can more quickly return to your normal activities and healthy exercise.  Put ice on the injured area.  Put ice in a plastic bag.  Place a towel between your skin and the bag.  Leave the ice on for 15-20 minutes, 03-04 times a day for the first 2 to 3 days. After that, ice and heat may be alternated to reduce pain and spasms.  Ask your caregiver about trying back exercises and gentle massage. This may be of some benefit.  Avoid feeling anxious or stressed.Stress increases muscle tension and can worsen back pain.It is important to recognize when you are anxious or stressed and learn ways to manage it.Exercise is a great option. SEEK MEDICAL CARE IF:  You  have pain that is not relieved with rest or medicine.  You have pain that does not improve in 1 week.  You have new symptoms.  You are generally not feeling well. SEEK IMMEDIATE MEDICAL CARE IF:   You have pain that radiates from your back into your legs.  You develop new bowel or bladder control problems.  You have unusual weakness or numbness in your arms or legs.  You develop nausea or vomiting.  You develop abdominal pain.  You feel faint. Document Released: 05/05/2005  Document Revised: 11/04/2011 Document Reviewed: 09/06/2013 [facility] Patient Information 2015 [place], [place]. This information is not intended to replace advice given to you by your health care provider. Make sure you discuss any questions you have with your health care provider.   Lumbosacral Strain Lumbosacral strain is a strain of any of the parts that make up your lumbosacral vertebrae. Your lumbosacral vertebrae are the bones that make up the lower third of your backbone. Your lumbosacral vertebrae are held together by muscles and tough, fibrous tissue (ligaments).  CAUSES  A sudden blow to your back can cause lumbosacral strain. Also, anything that causes an excessive stretch of the muscles in the low back can cause this strain. This is typically seen when people exert themselves strenuously, fall, lift heavy objects, bend, or crouch repeatedly. RISK FACTORS  Physically demanding work.  Participation in pushing or pulling sports or sports that require a sudden twist of the back (tennis, golf, baseball).  Weight lifting.  Excessive lower back curvature.  Forward-tilted pelvis.  Weak back or abdominal muscles or both.  Tight hamstrings. SIGNS AND SYMPTOMS  Lumbosacral strain may cause pain in the area of your injury or pain that moves (radiates) down your leg.  DIAGNOSIS Your health care provider can often diagnose lumbosacral strain through a physical exam. In some cases, you may need tests such as X-ray exams.  TREATMENT  Treatment for your lower back injury depends on many factors that your clinician will have to evaluate. However, most treatment will include the use of anti-inflammatory medicines. HOME CARE INSTRUCTIONS   Avoid hard physical activities (tennis, racquetball, waterskiing) if you are not in proper physical condition for it. This may aggravate or create problems.  If you have a back problem, avoid sports requiring sudden body movements. Swimming and walking are  generally safer activities.  Maintain good posture.  Maintain a healthy weight.  For acute conditions, you may put ice on the injured area.  Put ice in a plastic bag.  Place a towel between your skin and the bag.  Leave the ice on for 20 minutes, 2-3 times a day.  When the low back starts healing, stretching and strengthening exercises may be recommended. SEEK MEDICAL CARE IF:  Your back pain is getting worse.  You experience severe back pain not relieved with medicines. SEEK IMMEDIATE MEDICAL CARE IF:   You have numbness, tingling, weakness, or problems with the use of your arms or legs.  There is a change in bowel or bladder control.  You have increasing pain in any area of the body, including your belly (abdomen).  You notice shortness of breath, dizziness, or feel faint.  You feel sick to your stomach (nauseous), are throwing up (vomiting), or become sweaty.  You notice discoloration of your toes or legs, or your feet get very cold. MAKE SURE YOU:   Understand these instructions.  Will watch your condition.  Will get help right away if you are not doing  well or get worse. Document Released: 02/12/2005 Document Revised: 05/10/2013 Document Reviewed: 12/22/2012 [facility] Patient Information 2015 [place]. This information is not intended to replace advice given to you by your health care provider. Make sure you discuss any questions you have with your health care provider.   Back Exercises These exercises may help you when beginning to rehabilitate your injury. Your symptoms may resolve with or without further involvement from your physician, physical therapist or athletic trainer. While completing these exercises, remember:   Restoring tissue flexibility helps normal motion to return to the joints. This allows healthier, less painful movement and activity.  An effective stretch should be held for at least 30 seconds.  A stretch should never be painful. You  should only feel a gentle lengthening or release in the stretched tissue. STRETCH - Extension, Prone on Elbows   Lie on your stomach on the floor, a bed will be too soft. Place your palms about shoulder width apart and at the height of your head.  Place your elbows under your shoulders. If this is too painful, stack pillows under your chest.  Allow your body to relax so that your hips drop lower and make contact more completely with the floor.  Hold this position for __________ seconds.  Slowly return to lying flat on the floor. Repeat __________ times. Complete this exercise __________ times per day.  RANGE OF MOTION - Extension, Prone Press Ups   Lie on your stomach on the floor, a bed will be too soft. Place your palms about shoulder width apart and at the height of your head.  Keeping your back as relaxed as possible, slowly straighten your elbows while keeping your hips on the floor. You may adjust the placement of your hands to maximize your comfort. As you gain motion, your hands will come more underneath your shoulders.  Hold this position __________ seconds.  Slowly return to lying flat on the floor. Repeat __________ times. Complete this exercise __________ times per day.  RANGE OF MOTION- Quadruped, Neutral Spine   Assume a hands and knees position on a firm surface. Keep your hands under your shoulders and your knees under your hips. You may place padding under your knees for comfort.  Drop your head and point your tail bone toward the ground below you. This will round out your low back like an angry cat. Hold this position for __________ seconds.  Slowly lift your head and release your tail bone so that your back sags into a large arch, like an old horse.  Hold this position for __________ seconds.  Repeat this until you feel limber in your low back.  Now, find your "sweet spot." This will be the most comfortable position somewhere between the two previous positions.  This is your neutral spine. Once you have found this position, tense your stomach muscles to support your low back.  Hold this position for __________ seconds. Repeat __________ times. Complete this exercise __________ times per day.  STRETCH - Flexion, Single Knee to Chest   Lie on a firm bed or floor with both legs extended in front of you.  Keeping one leg in contact with the floor, bring your opposite knee to your chest. Hold your leg in place by either grabbing behind your thigh or at your knee.  Pull until you feel a gentle stretch in your low back. Hold __________ seconds.  Slowly release your grasp and repeat the exercise with the opposite side. Repeat __________ times. Complete  this exercise __________ times per day.  STRETCH - Hamstrings, Standing  Stand or sit and extend your right / left leg, placing your foot on a chair or foot stool  Keeping a slight arch in your low back and your hips straight forward.  Lead with your chest and lean forward at the waist until you feel a gentle stretch in the back of your right / left knee or thigh. (When done correctly, this exercise requires leaning only a small distance.)  Hold this position for __________ seconds. Repeat __________ times. Complete this stretch __________ times per day. STRENGTHENING - Deep Abdominals, Pelvic Tilt   Lie on a firm bed or floor. Keeping your legs in front of you, bend your knees so they are both pointed toward the ceiling and your feet are flat on the floor.  Tense your lower abdominal muscles to press your low back into the floor. This motion will rotate your pelvis so that your tail bone is scooping upwards rather than pointing at your feet or into the floor.  With a gentle tension and even breathing, hold this position for __________ seconds. Repeat __________ times. Complete this exercise __________ times per day.  STRENGTHENING - Abdominals, Crunches   Lie on a firm bed or floor. Keeping your  legs in front of you, bend your knees so they are both pointed toward the ceiling and your feet are flat on the floor. Cross your arms over your chest.  Slightly tip your chin down without bending your neck.  Tense your abdominals and slowly lift your trunk high enough to just clear your shoulder blades. Lifting higher can put excessive stress on the low back and does not further strengthen your abdominal muscles.  Control your return to the starting position. Repeat __________ times. Complete this exercise __________ times per day.  STRENGTHENING - Quadruped, Opposite UE/LE Lift   Assume a hands and knees position on a firm surface. Keep your hands under your shoulders and your knees under your hips. You may place padding under your knees for comfort.  Find your neutral spine and gently tense your abdominal muscles so that you can maintain this position. Your shoulders and hips should form a rectangle that is parallel with the floor and is not twisted.  Keeping your trunk steady, lift your right hand no higher than your shoulder and then your left leg no higher than your hip. Make sure you are not holding your breath. Hold this position __________ seconds.  Continuing to keep your abdominal muscles tense and your back steady, slowly return to your starting position. Repeat with the opposite arm and leg. Repeat __________ times. Complete this exercise __________ times per day. Document Released: 05/23/2005 Document Revised: 07/28/2011 Document Reviewed: 08/17/2008 [facility] Patient Information 2015 [place], [place]. This information is not intended to replace advice given to you by your health care provider. Make sure you discuss any questions you have with your health care provider.

## 2014-12-08 ENCOUNTER — Ambulatory Visit: Payer: Self-pay

## 2014-12-28 ENCOUNTER — Other Ambulatory Visit: Payer: Self-pay | Admitting: Family Medicine

## 2015-06-18 ENCOUNTER — Encounter: Payer: Self-pay | Admitting: Family Medicine

## 2015-06-18 ENCOUNTER — Ambulatory Visit (INDEPENDENT_AMBULATORY_CARE_PROVIDER_SITE_OTHER): Payer: BLUE CROSS/BLUE SHIELD | Admitting: Family Medicine

## 2015-06-18 VITALS — BP 150/90 | HR 90 | Wt 155.0 lb

## 2015-06-18 DIAGNOSIS — D709 Neutropenia, unspecified: Secondary | ICD-10-CM | POA: Diagnosis not present

## 2015-06-18 DIAGNOSIS — Z79899 Other long term (current) drug therapy: Secondary | ICD-10-CM | POA: Diagnosis not present

## 2015-06-18 DIAGNOSIS — Z1159 Encounter for screening for other viral diseases: Secondary | ICD-10-CM

## 2015-06-18 DIAGNOSIS — I10 Essential (primary) hypertension: Secondary | ICD-10-CM

## 2015-06-18 LAB — COMPREHENSIVE METABOLIC PANEL
ALBUMIN: 4.1 g/dL (ref 3.6–5.1)
ALT: 19 U/L (ref 9–46)
AST: 17 U/L (ref 10–35)
Alkaline Phosphatase: 59 U/L (ref 40–115)
BUN: 10 mg/dL (ref 7–25)
CHLORIDE: 102 mmol/L (ref 98–110)
CO2: 33 mmol/L — AB (ref 20–31)
CREATININE: 0.89 mg/dL (ref 0.70–1.33)
Calcium: 9.8 mg/dL (ref 8.6–10.3)
Glucose, Bld: 89 mg/dL (ref 65–99)
Potassium: 3.7 mmol/L (ref 3.5–5.3)
SODIUM: 140 mmol/L (ref 135–146)
Total Bilirubin: 0.7 mg/dL (ref 0.2–1.2)
Total Protein: 7.5 g/dL (ref 6.1–8.1)

## 2015-06-18 LAB — CBC WITH DIFFERENTIAL/PLATELET
BASOS PCT: 0 % (ref 0–1)
Basophils Absolute: 0 10*3/uL (ref 0.0–0.1)
Eosinophils Absolute: 0.3 10*3/uL (ref 0.0–0.7)
Eosinophils Relative: 12 % — ABNORMAL HIGH (ref 0–5)
HCT: 46.5 % (ref 39.0–52.0)
Hemoglobin: 15 g/dL (ref 13.0–17.0)
LYMPHS ABS: 1 10*3/uL (ref 0.7–4.0)
Lymphocytes Relative: 34 % (ref 12–46)
MCH: 25.5 pg — ABNORMAL LOW (ref 26.0–34.0)
MCHC: 32.3 g/dL (ref 30.0–36.0)
MCV: 78.9 fL (ref 78.0–100.0)
MONO ABS: 0.3 10*3/uL (ref 0.1–1.0)
MPV: 10 fL (ref 8.6–12.4)
Monocytes Relative: 12 % (ref 3–12)
Neutro Abs: 1.2 10*3/uL — ABNORMAL LOW (ref 1.7–7.7)
Neutrophils Relative %: 42 % — ABNORMAL LOW (ref 43–77)
PLATELETS: 223 10*3/uL (ref 150–400)
RBC: 5.89 MIL/uL — ABNORMAL HIGH (ref 4.22–5.81)
RDW: 14.8 % (ref 11.5–15.5)
WBC: 2.8 10*3/uL — ABNORMAL LOW (ref 4.0–10.5)

## 2015-06-18 LAB — LIPID PANEL
CHOL/HDL RATIO: 4.1 ratio (ref <=5.0)
CHOLESTEROL: 178 mg/dL (ref 125–200)
HDL: 43 mg/dL (ref >=40)
LDL Cholesterol: 117 mg/dL (ref <130)
TRIGLYCERIDES: 90 mg/dL (ref <150)
VLDL: 18 mg/dL (ref <30)

## 2015-06-18 LAB — HEPATITIS C ANTIBODY: HCV AB: NEGATIVE

## 2015-06-18 MED ORDER — LISINOPRIL-HYDROCHLOROTHIAZIDE 20-12.5 MG PO TABS: 2.0000 | ORAL_TABLET | Freq: Every day | ORAL | Status: DC

## 2015-06-18 NOTE — Progress Notes (Signed)
   Subjective:    Patient ID: Cameron Schmitt, male    DOB: [birth year], 55 y.o.   MRN: [id number]  HPI He is here for follow-up visit. He did lose his insurance and then stop taking his blood pressure medications. He would like to get back on them. He also has a history of neutropenia. He has no other concerns or complaints.   Review of Systems     Objective:   Physical Exam Alert and in no distress. Blood pressure is recorded Chart was reviewed and he is up-to-date on his immunizations and health maintenance.      Assessment & Plan:  Essential hypertension - Plan: lisinopril-hydrochlorothiazide (PRINZIDE,ZESTORETIC) 20-12.5 MG tablet, CBC with Differential/Platelet, Comprehensive metabolic panel, Lipid panel  Neutropenia, unspecified type (HCC) - Plan: CBC with Differential/Platelet  Need for hepatitis C screening test - Plan: Hepatitis C antibody  Encounter for long-term (current) use of medications - Plan: CBC with Differential/Platelet, Comprehensive metabolic panel, Lipid panel I will place him back on medication, have him return here in one month.

## 2015-07-17 ENCOUNTER — Ambulatory Visit (INDEPENDENT_AMBULATORY_CARE_PROVIDER_SITE_OTHER): Payer: BLUE CROSS/BLUE SHIELD | Admitting: Family Medicine

## 2015-07-17 ENCOUNTER — Encounter: Payer: Self-pay | Admitting: Family Medicine

## 2015-07-17 VITALS — BP 146/90 | HR 91 | Wt 157.5 lb

## 2015-07-17 DIAGNOSIS — I10 Essential (primary) hypertension: Secondary | ICD-10-CM | POA: Diagnosis not present

## 2015-07-17 MED ORDER — AMLODIPINE BESYLATE 5 MG PO TABS: 5.0000 mg | ORAL_TABLET | Freq: Every day | ORAL | Status: DC

## 2015-07-17 NOTE — Progress Notes (Signed)
   Subjective:    Patient ID: Cameron Schmitt, male    DOB: 05-Feb-1961, 54 y.o.   MRN: [id number]  HPI He is here for a recheck. He is now taking lisinopril and having no difficulty with this.   Review of Systems     Objective:   Physical Exam Alert and in no distress. Blood pressure is recorded.       Assessment & Plan:  Essential hypertension - Plan: amLODipine (NORVASC) 5 MG tablet I will add amlodipine to his regimen. Recheck here in one month. Encouraged him to start back and exercise as he states his back pain is now much better.

## 2015-08-13 ENCOUNTER — Ambulatory Visit: Payer: BLUE CROSS/BLUE SHIELD | Admitting: Family Medicine

## 2015-08-20 ENCOUNTER — Encounter: Payer: Self-pay | Admitting: Family Medicine

## 2015-09-27 ENCOUNTER — Telehealth: Payer: Self-pay | Admitting: Internal Medicine

## 2015-09-27 DIAGNOSIS — I10 Essential (primary) hypertension: Secondary | ICD-10-CM

## 2015-09-27 MED ORDER — LISINOPRIL-HYDROCHLOROTHIAZIDE 20-12.5 MG PO TABS: 2.0000 | ORAL_TABLET | Freq: Every day | ORAL | Status: DC

## 2015-09-27 NOTE — Telephone Encounter (Signed)
Pt was scheduled appt for Tuesday and was going to run out of his med until his appt. Refilled med for 30 days

## 2015-10-02 ENCOUNTER — Ambulatory Visit (INDEPENDENT_AMBULATORY_CARE_PROVIDER_SITE_OTHER): Payer: BLUE CROSS/BLUE SHIELD | Admitting: Family Medicine

## 2015-10-02 VITALS — BP 120/72 | HR 85 | Wt 157.0 lb

## 2015-10-02 DIAGNOSIS — I10 Essential (primary) hypertension: Secondary | ICD-10-CM | POA: Diagnosis not present

## 2015-10-02 MED ORDER — LISINOPRIL-HYDROCHLOROTHIAZIDE 20-12.5 MG PO TABS: 2.0000 | ORAL_TABLET | Freq: Every day | ORAL | Status: DC

## 2015-10-02 MED ORDER — AMLODIPINE BESYLATE 5 MG PO TABS: 5.0000 mg | ORAL_TABLET | Freq: Every day | ORAL | Status: DC

## 2015-10-02 NOTE — Progress Notes (Signed)
   Subjective:    Patient ID: Cameron Schmitt, male    DOB: 12/13/1960, 55 y.o.   MRN: [id number]  HPI  he is here for recheck on his blood pressure. He continues on lisinopril and amlodipine and is having no difficulty with them.   Review of Systems     Objective:   Physical Exam  alert and in no distress. Blood pressure is recorded.       Assessment & Plan:  Essential hypertension - Plan: amLODipine (NORVASC) 5 MG tablet, lisinopril-hydrochlorothiazide (PRINZIDE,ZESTORETIC) 20-12.5 MG tablet   Present medication regimen. Return here in 1 year.

## 2016-03-17 ENCOUNTER — Ambulatory Visit (INDEPENDENT_AMBULATORY_CARE_PROVIDER_SITE_OTHER): Payer: BLUE CROSS/BLUE SHIELD | Admitting: Family Medicine

## 2016-03-17 ENCOUNTER — Encounter: Payer: Self-pay | Admitting: Family Medicine

## 2016-03-17 VITALS — BP 138/90 | HR 85 | Ht 68.0 in | Wt 165.0 lb

## 2016-03-17 DIAGNOSIS — R109 Unspecified abdominal pain: Secondary | ICD-10-CM | POA: Diagnosis not present

## 2016-03-17 DIAGNOSIS — I10 Essential (primary) hypertension: Secondary | ICD-10-CM | POA: Diagnosis not present

## 2016-03-17 NOTE — Progress Notes (Signed)
   Subjective:    Patient ID: Cameron Schmitt, male    DOB: 12/22/1960, 55 y.o.   MRN: [id number]  HPI He is here for evaluation of left flank pain. He has had 2 episodes of this. First one occurred approximately one year ago. Around that same time he ran out of his blood pressure medication and stopped and notes that the pain went away. He was then placed back on his blood pressure medicine in May of this year and notes that the pain is reoccurred. He notes it in the left flank. When he lies on the right side he will feel discomfort on the left. He also notes some discomfort when he stands but this is not interfered with work or his ADLs. He will rarely take an Advil.   Review of Systems     Objective:   Physical Exam Alert and in no distress. Exam of his back shows no tenderness to palpation. 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 Full hip motion with negative leg raising and normal DTRs       Assessment & Plan:  Left flank pain  Essential hypertension I explained that this could possibly be an unusual side effect to the medication recommend he stop this for 1 or 2 weeks and let me know whether the pain goes away. If it doesn't I will switch to a different blood pressure medication. If no change in his pain, and x-ray and possible physical therapy would be warranted.

## 2016-03-17 NOTE — Patient Instructions (Signed)
Stop your medication for 1 or 2 weeks and let me know how you're doing. If your pain goes away I'll just switch if your pain doesn't go away it time for x-rays and possible physical therapy

## 2016-05-30 ENCOUNTER — Other Ambulatory Visit: Payer: Self-pay | Admitting: Family Medicine

## 2016-05-30 DIAGNOSIS — I10 Essential (primary) hypertension: Secondary | ICD-10-CM

## 2016-10-10 ENCOUNTER — Other Ambulatory Visit: Payer: Self-pay | Admitting: Family Medicine

## 2016-10-10 DIAGNOSIS — I10 Essential (primary) hypertension: Secondary | ICD-10-CM

## 2017-01-28 ENCOUNTER — Encounter: Payer: Self-pay | Admitting: Family Medicine

## 2017-01-28 ENCOUNTER — Ambulatory Visit (INDEPENDENT_AMBULATORY_CARE_PROVIDER_SITE_OTHER): Payer: BLUE CROSS/BLUE SHIELD | Admitting: Family Medicine

## 2017-01-28 VITALS — BP 122/70 | HR 83 | Resp 16 | Wt 157.4 lb

## 2017-01-28 DIAGNOSIS — L918 Other hypertrophic disorders of the skin: Secondary | ICD-10-CM

## 2017-01-28 DIAGNOSIS — Z79899 Other long term (current) drug therapy: Secondary | ICD-10-CM | POA: Diagnosis not present

## 2017-01-28 DIAGNOSIS — I10 Essential (primary) hypertension: Secondary | ICD-10-CM | POA: Diagnosis not present

## 2017-01-28 DIAGNOSIS — D709 Neutropenia, unspecified: Secondary | ICD-10-CM

## 2017-01-28 DIAGNOSIS — M545 Low back pain, unspecified: Secondary | ICD-10-CM

## 2017-01-28 DIAGNOSIS — L919 Hypertrophic disorder of the skin, unspecified: Secondary | ICD-10-CM | POA: Diagnosis not present

## 2017-01-28 LAB — CBC WITH DIFFERENTIAL/PLATELET
BASOS PCT: 0.8 %
Basophils Absolute: 20 cells/uL (ref 0–200)
Eosinophils Absolute: 108 cells/uL (ref 15–500)
Eosinophils Relative: 4.3 %
HEMATOCRIT: 45.2 % (ref 38.5–50.0)
Hemoglobin: 14.6 g/dL (ref 13.2–17.1)
LYMPHS ABS: 860 {cells}/uL (ref 850–3900)
MCH: 25.8 pg — AB (ref 27.0–33.0)
MCHC: 32.3 g/dL (ref 32.0–36.0)
MCV: 79.9 fL — AB (ref 80.0–100.0)
MPV: 11.4 fL (ref 7.5–12.5)
Monocytes Relative: 9.1 %
NEUTROS PCT: 51.4 %
Neutro Abs: 1285 cells/uL — ABNORMAL LOW (ref 1500–7800)
Platelets: 206 10*3/uL (ref 140–400)
RBC: 5.66 10*6/uL (ref 4.20–5.80)
RDW: 12.3 % (ref 11.0–15.0)
Total Lymphocyte: 34.4 %
WBC: 2.5 10*3/uL — ABNORMAL LOW (ref 3.8–10.8)
WBCMIX: 228 {cells}/uL (ref 200–950)

## 2017-01-28 LAB — COMPREHENSIVE METABOLIC PANEL
AG Ratio: 1.2 (calc) (ref 1.0–2.5)
ALT: 20 U/L (ref 9–46)
AST: 17 U/L (ref 10–35)
Albumin: 4.3 g/dL (ref 3.6–5.1)
Alkaline phosphatase (APISO): 60 U/L (ref 40–115)
BUN: 12 mg/dL (ref 7–25)
CALCIUM: 9.9 mg/dL (ref 8.6–10.3)
CO2: 31 mmol/L (ref 20–32)
Chloride: 100 mmol/L (ref 98–110)
Creat: 1.12 mg/dL (ref 0.70–1.33)
Globulin: 3.5 g/dL (calc) (ref 1.9–3.7)
Glucose, Bld: 103 mg/dL — ABNORMAL HIGH (ref 65–99)
Potassium: 3.4 mmol/L — ABNORMAL LOW (ref 3.5–5.3)
SODIUM: 138 mmol/L (ref 135–146)
TOTAL PROTEIN: 7.8 g/dL (ref 6.1–8.1)
Total Bilirubin: 0.6 mg/dL (ref 0.2–1.2)

## 2017-01-28 LAB — LIPID PANEL
Cholesterol: 200 mg/dL — ABNORMAL HIGH (ref <200)
HDL: 44 mg/dL (ref >40)
LDL CHOLESTEROL (CALC): 134 mg/dL — AB
NON-HDL CHOLESTEROL (CALC): 156 mg/dL — AB (ref <130)
TRIGLYCERIDES: 112 mg/dL (ref <150)
Total CHOL/HDL Ratio: 4.5 (calc) (ref <5.0)

## 2017-01-28 MED ORDER — LISINOPRIL-HYDROCHLOROTHIAZIDE 20-12.5 MG PO TABS: 2.0000 | ORAL_TABLET | Freq: Every day | ORAL | 3 refills | Status: DC

## 2017-01-28 MED ORDER — AMLODIPINE BESYLATE 5 MG PO TABS: 5.0000 mg | ORAL_TABLET | Freq: Every day | ORAL | 3 refills | Status: DC

## 2017-01-28 MED ORDER — LIDOCAINE-EPINEPHRINE 2 %-1:100000 IJ SOLN
0.5000 mL | Freq: Once | INTRAMUSCULAR | Status: AC
  Administered 2017-01-28: 0.5 mL

## 2017-01-28 NOTE — Patient Instructions (Signed)
Heat for 20 minutes 3 times per day and gentle stretching after that. You can take up to 800 mg 3 times per day of Advil

## 2017-01-28 NOTE — Progress Notes (Signed)
   Subjective:    Patient ID: Cameron Schmitt, male    DOB: 06/25/1960, 56 y.o.   MRN: [id number]  HPI He is here for an interval evaluation. He continues on his blood pressure medications and is having no difficulty with that. He does have a skin tag present on his right forearm just distal to the elbow over the ulna. Also Saturday while doing some yard work he twisted his back and has now had some intermittent stiffness and soreness in his low back but no numbness, tingling or weakness.  The symptoms are getting better. He has tried some heat and icy hot. He also has a history of neutropenia.His work and home life are going well. He does not smoke and drinks socially He has been married now for 3 years and this seems to be going well.   Review of Systems     Objective:   Physical Exam Alert and in no distress. Tympanic membranes and canals are normal. Pharyngeal area is normal. Neck is supple without adenopathy or thyromegaly. Cardiac exam shows a regular sinus rhythm without murmurs or gallops. Lungs are clear to auscultation. Skin tag with a pedunculated base noted on the above-mentioned forearm area. Back exam shows no tenderness palpation with normal lumbar motion. Negative straight leg raising. Normal hip motion.       Assessment & Plan:  Essential hypertension - Plan: lisinopril-hydrochlorothiazide (PRINZIDE,ZESTORETIC) 20-12.5 MG tablet, amLODipine (NORVASC) 5 MG tablet  Neutropenia, unspecified type (HCC) - Plan: CBC with Differential/Platelet  Acute bilateral low back pain without sciatica  Skin tag  Encounter for long-term (current) use of medications - Plan: CBC with Differential/Platelet, Comprehensive metabolic panel, Lipid panel Recommend heat, stretching and an state of choice for his back. Also discussed proper lifting and sitting. Skin tag was injected with Xylocaine and epinephrine and excised. The base was hyfrecated with silver nitrate. Sent for path

## 2017-02-04 ENCOUNTER — Encounter: Payer: Self-pay | Admitting: Family Medicine

## 2017-10-13 ENCOUNTER — Other Ambulatory Visit: Payer: Self-pay | Admitting: Family Medicine

## 2017-10-13 DIAGNOSIS — I10 Essential (primary) hypertension: Secondary | ICD-10-CM

## 2017-12-05 ENCOUNTER — Other Ambulatory Visit: Payer: Self-pay | Admitting: Family Medicine

## 2017-12-05 DIAGNOSIS — I10 Essential (primary) hypertension: Secondary | ICD-10-CM

## 2017-12-30 ENCOUNTER — Encounter: Payer: Self-pay | Admitting: Family Medicine

## 2018-01-29 ENCOUNTER — Other Ambulatory Visit: Payer: Self-pay | Admitting: Family Medicine

## 2018-01-29 DIAGNOSIS — I10 Essential (primary) hypertension: Secondary | ICD-10-CM

## 2018-03-08 ENCOUNTER — Ambulatory Visit (INDEPENDENT_AMBULATORY_CARE_PROVIDER_SITE_OTHER): Payer: BLUE CROSS/BLUE SHIELD | Admitting: Family Medicine

## 2018-03-08 ENCOUNTER — Encounter: Payer: Self-pay | Admitting: Family Medicine

## 2018-03-08 VITALS — BP 164/98 | HR 76 | Temp 97.7°F | Ht 67.0 in | Wt 157.8 lb

## 2018-03-08 DIAGNOSIS — M542 Cervicalgia: Secondary | ICD-10-CM

## 2018-03-08 DIAGNOSIS — D709 Neutropenia, unspecified: Secondary | ICD-10-CM | POA: Diagnosis not present

## 2018-03-08 DIAGNOSIS — M7712 Lateral epicondylitis, left elbow: Secondary | ICD-10-CM

## 2018-03-08 DIAGNOSIS — Z0000 Encounter for general adult medical examination without abnormal findings: Secondary | ICD-10-CM

## 2018-03-08 DIAGNOSIS — Z125 Encounter for screening for malignant neoplasm of prostate: Secondary | ICD-10-CM | POA: Diagnosis not present

## 2018-03-08 DIAGNOSIS — R1909 Other intra-abdominal and pelvic swelling, mass and lump: Secondary | ICD-10-CM

## 2018-03-08 DIAGNOSIS — I10 Essential (primary) hypertension: Secondary | ICD-10-CM | POA: Diagnosis not present

## 2018-03-08 NOTE — Progress Notes (Signed)
Subjective:    Patient ID: Cameron Schmitt, male    DOB: 1960/08/01, 57 y.o.   MRN: [id number]  HPI He is here for complete examination.  He has noted some difficulty with pain in the left lateral elbow area.  He has a new job which requires the use of his left hand in a pronated position.  He also states that he has had intermittent difficulty with neck pain.  He was seen approximately 5 years ago and since then has had intermittent trouble.  None at the present time.  He states that he did skip a couple doses of his blood pressure medication.  His family and social history as well as health maintenance and immunizations was reviewed.  Past medical history is significant for left inguinal mass.  He states that this was evaluated several years ago and told that it was benign.  He states that it has not changed.    Review of Systems  All other systems reviewed and are negative.      Objective:   Physical Exam BP (!) 164/98 (BP Location: Left Arm, Patient Position: Sitting)   Pulse 76   Temp 97.7 F (36.5 C)   Ht 5\' 7"  (1.702 m)   Wt 157 lb 12.8 oz (71.6 kg)   SpO2 98%   BMI 24.71 kg/m   General Appearance:    Alert, cooperative, no distress, appears stated age  Head:    Normocephalic, without obvious abnormality, atraumatic  Eyes:    PERRL, conjunctiva/corneas clear, EOM's intact, fundi    benign  Ears:    Normal TM's and external ear canals  Nose:   Nares normal, mucosa normal, no drainage or sinus   tenderness  Throat:   Lips, mucosa, and tongue normal; teeth and gums normal  Neck:   Supple, no lymphadenopathy;  thyroid:  no   enlargement/tenderness/nodules; no carotid   bruit or JVD     Lungs:     Clear to auscultation bilaterally without wheezes, rales or     ronchi; respirations unlabored      Heart:    Regular rate and rhythm, S1 and S2 normal, no murmur, rub   or gallop     Abdomen:     Soft, non-tender, nondistended, normoactive bowel sounds,    no masses, no  hepatosplenomegaly  Genitalia:    Normal male external genitalia without lesions.  Testicles without masses.  4 cm smooth round movable lesion noted in the left inguinal area.  It does not change with coughing.  Rectal:   Deferred  Extremities:   No clubbing, cyanosis or edema.tender to palpation of the left lateral epicondyle.  Provocative testing cause more pain.  Pulses:   2+ and symmetric all extremities  Skin:   Skin color, texture, turgor normal, no rashes or lesions  Lymph nodes:   Cervical, supraclavicular, and axillary nodes normal  Neurologic:   CNII-XII intact, normal strength, sensation and gait; reflexes 2+ and symmetric throughout          Psych:   Normal mood, affect, hygiene and grooming.          Assessment & Plan:  Routine general medical examination at a health care facility - Plan: CBC with Differential/Platelet, Comprehensive metabolic panel, Lipid panel  Essential hypertension  Neutropenia, unspecified type (HCC)  Lateral epicondylitis of left elbow  Mass of left inguinal region - Plan: Ambulatory referral to General Surgery  Neck pain  Screening for prostate cancer - Plan: PSA He  is to return here in 1 month for recheck of his blood pressure.  We will also refer him to general surgery to get another opinion on the inguinal mass.  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 Recommend that he do as many things palms up and open and if continued difficulty, return here.  I explained that this is an overuse type injury because it is a new job Recommend conservative care for the neck pain as it has worked in the past.

## 2018-03-08 NOTE — Patient Instructions (Signed)
Do as many things as you can palms up and open 

## 2018-03-09 LAB — CBC WITH DIFFERENTIAL/PLATELET
BASOS: 1 %
Basophils Absolute: 0 10*3/uL (ref 0.0–0.2)
EOS (ABSOLUTE): 0.1 10*3/uL (ref 0.0–0.4)
Eos: 2 %
HEMOGLOBIN: 14.5 g/dL (ref 13.0–17.7)
Hematocrit: 44.7 % (ref 37.5–51.0)
IMMATURE GRANS (ABS): 0 10*3/uL (ref 0.0–0.1)
Immature Granulocytes: 0 %
LYMPHS ABS: 1.2 10*3/uL (ref 0.7–3.1)
LYMPHS: 41 %
MCH: 25.7 pg — AB (ref 26.6–33.0)
MCHC: 32.4 g/dL (ref 31.5–35.7)
MCV: 79 fL (ref 79–97)
Monocytes Absolute: 0.3 10*3/uL (ref 0.1–0.9)
Monocytes: 11 %
NEUTROS ABS: 1.3 10*3/uL — AB (ref 1.4–7.0)
Neutrophils: 45 %
PLATELETS: 249 10*3/uL (ref 150–450)
RBC: 5.64 x10E6/uL (ref 4.14–5.80)
RDW: 12.8 % (ref 12.3–15.4)
WBC: 2.9 10*3/uL — ABNORMAL LOW (ref 3.4–10.8)

## 2018-03-09 LAB — COMPREHENSIVE METABOLIC PANEL
ALBUMIN: 4.8 g/dL (ref 3.5–5.5)
ALK PHOS: 62 IU/L (ref 39–117)
ALT: 25 IU/L (ref 0–44)
AST: 20 IU/L (ref 0–40)
Albumin/Globulin Ratio: 1.5 (ref 1.2–2.2)
BILIRUBIN TOTAL: 0.7 mg/dL (ref 0.0–1.2)
BUN / CREAT RATIO: 12 (ref 9–20)
BUN: 13 mg/dL (ref 6–24)
CHLORIDE: 97 mmol/L (ref 96–106)
CO2: 29 mmol/L (ref 20–29)
CREATININE: 1.08 mg/dL (ref 0.76–1.27)
Calcium: 9.9 mg/dL (ref 8.7–10.2)
GFR calc non Af Amer: 75 mL/min/{1.73_m2} (ref >59)
GFR, EST AFRICAN AMERICAN: 87 mL/min/{1.73_m2} (ref >59)
GLOBULIN, TOTAL: 3.3 g/dL (ref 1.5–4.5)
Glucose: 80 mg/dL (ref 65–99)
Potassium: 4.3 mmol/L (ref 3.5–5.2)
SODIUM: 139 mmol/L (ref 134–144)
TOTAL PROTEIN: 8.1 g/dL (ref 6.0–8.5)

## 2018-03-09 LAB — LIPID PANEL
Chol/HDL Ratio: 4.5 ratio (ref 0.0–5.0)
Cholesterol, Total: 235 mg/dL — ABNORMAL HIGH (ref 100–199)
HDL: 52 mg/dL (ref >39)
LDL CALC: 170 mg/dL — AB (ref 0–99)
Triglycerides: 66 mg/dL (ref 0–149)
VLDL CHOLESTEROL CAL: 13 mg/dL (ref 5–40)

## 2018-03-09 LAB — PSA: PROSTATE SPECIFIC AG, SERUM: 1.4 ng/mL (ref 0.0–4.0)

## 2018-03-19 ENCOUNTER — Other Ambulatory Visit: Payer: Self-pay | Admitting: Family Medicine

## 2018-03-19 DIAGNOSIS — I10 Essential (primary) hypertension: Secondary | ICD-10-CM

## 2018-04-02 ENCOUNTER — Ambulatory Visit (INDEPENDENT_AMBULATORY_CARE_PROVIDER_SITE_OTHER): Payer: BLUE CROSS/BLUE SHIELD | Admitting: Family Medicine

## 2018-04-02 ENCOUNTER — Encounter: Payer: Self-pay | Admitting: Family Medicine

## 2018-04-02 VITALS — BP 128/88 | HR 97 | Temp 98.2°F | Wt 161.0 lb

## 2018-04-02 DIAGNOSIS — I10 Essential (primary) hypertension: Secondary | ICD-10-CM | POA: Diagnosis not present

## 2018-04-02 NOTE — Progress Notes (Signed)
   Subjective:    Patient ID: Cameron Schmitt, male    DOB: 10/28/1960, 57 y.o.   MRN: [id number]  HPI He is here for recheck on his blood pressure.  He continues on lisinopril and amlodipine.  He has not been exercising like he normally does.   Review of Systems     Objective:   Physical Exam Alert and in no distress.  Blood pressure is recorded.       Assessment & Plan:  Essential hypertension His diastolic is slightly elevated.  I discussed this with him and we will recheck this in several months after he gets a chance to get back into his regular exercise regimen.

## 2018-04-24 ENCOUNTER — Other Ambulatory Visit: Payer: Self-pay | Admitting: Family Medicine

## 2018-04-24 DIAGNOSIS — I10 Essential (primary) hypertension: Secondary | ICD-10-CM

## 2018-04-30 DIAGNOSIS — R1909 Other intra-abdominal and pelvic swelling, mass and lump: Secondary | ICD-10-CM | POA: Diagnosis not present

## 2018-05-28 ENCOUNTER — Other Ambulatory Visit: Payer: Self-pay | Admitting: Family Medicine

## 2018-05-28 DIAGNOSIS — I10 Essential (primary) hypertension: Secondary | ICD-10-CM

## 2018-07-12 ENCOUNTER — Other Ambulatory Visit: Payer: Self-pay | Admitting: Family Medicine

## 2018-07-12 DIAGNOSIS — I10 Essential (primary) hypertension: Secondary | ICD-10-CM

## 2018-08-16 ENCOUNTER — Other Ambulatory Visit: Payer: Self-pay | Admitting: Family Medicine

## 2018-08-16 DIAGNOSIS — I10 Essential (primary) hypertension: Secondary | ICD-10-CM

## 2018-09-21 ENCOUNTER — Other Ambulatory Visit: Payer: Self-pay | Admitting: Family Medicine

## 2018-09-21 DIAGNOSIS — I10 Essential (primary) hypertension: Secondary | ICD-10-CM

## 2018-11-04 ENCOUNTER — Other Ambulatory Visit: Payer: Self-pay | Admitting: Family Medicine

## 2018-11-04 DIAGNOSIS — I10 Essential (primary) hypertension: Secondary | ICD-10-CM

## 2018-12-23 ENCOUNTER — Encounter: Payer: Self-pay | Admitting: Family Medicine

## 2018-12-23 ENCOUNTER — Other Ambulatory Visit: Payer: Self-pay

## 2018-12-23 ENCOUNTER — Ambulatory Visit (INDEPENDENT_AMBULATORY_CARE_PROVIDER_SITE_OTHER): Payer: BC Managed Care – PPO | Admitting: Family Medicine

## 2018-12-23 VITALS — BP 138/84 | HR 85 | Temp 98.2°F | Wt 164.2 lb

## 2018-12-23 DIAGNOSIS — M5412 Radiculopathy, cervical region: Secondary | ICD-10-CM

## 2018-12-23 NOTE — Progress Notes (Signed)
   Acute Office Visit  Subjective:    Patient ID: Cameron Schmitt, male    DOB: 1961-03-20, 58 y.o.   MRN: [id number]  Chief Complaint  Patient presents with  . other    numbness in right arm while lifting head    HPI Patient is in today for numbness in rt arm for past 4 months. Describes sensation as tingling or pins and needles from the top of the arm radiating down to medial three fingers. Sensation will occur when lifting up head or sitting up straight. Sensation subsides with lowering head or slouching. Reports no neck pain. Reports no loss of range of motion during episodes. Had similar episodes in the past with the same symptoms. In 2010, he had an MRI which showed  small disk herniations. In 2014, was prescribed an NSAID  and noted that the doctors told him it was a temporary fix.   Past Medical History:  Diagnosis Date  . Hypertension   . Inguinal cyst   . Leukopenia     No past surgical history on file.  Family History  Problem Relation Age of Onset  . Diabetes Mother   . Hyperlipidemia Mother   . Hypertension Mother   . COPD Father    No Known Allergies  ROS     Objective:    Physical Exam   Examination of the upper extremities and neck showed full range of motion, normal strength, and normal tone and reflexes. Initiation of numbness occured when neck was dorsiflexed.   BP 138/84 (BP Location: Left Arm, Patient Position: Sitting)   Pulse 85   Temp 98.2 F (36.8 C)   Wt 164 lb 3.2 oz (74.5 kg)   SpO2 95%   BMI 25.72 kg/m  Wt Readings from Last 3 Encounters:  12/23/18 164 lb 3.2 oz (74.5 kg)  04/02/18 161 lb (73 kg)  03/08/18 157 lb 12.8 oz (71.6 kg)       Assessment & Plan:   Encounter Diagnosis  Name Primary?  . Cervical radiculopathy Yes    - 2 aleve 2x per day for the next couple of weeks. Follow-up if sensation does not stop or reoccurs.  Also discussed the possibility of adding steroids to his regimen if he does not improve   Ozella Almond, Medical Student

## 2019-01-01 ENCOUNTER — Other Ambulatory Visit: Payer: Self-pay | Admitting: Family Medicine

## 2019-01-01 DIAGNOSIS — I10 Essential (primary) hypertension: Secondary | ICD-10-CM

## 2019-01-07 ENCOUNTER — Telehealth: Payer: Self-pay | Admitting: Family Medicine

## 2019-01-07 MED ORDER — DICLOFENAC SODIUM 75 MG PO TBEC: 75.0000 mg | DELAYED_RELEASE_TABLET | Freq: Two times a day (BID) | ORAL | 0 refills | Status: DC

## 2019-01-07 NOTE — Telephone Encounter (Signed)
Pt called and said he has been taking aleve for his issue that he had at his last visit and it is not working. Pt wants to know if he can get something else.He uses the [facility] on [address].

## 2019-02-07 ENCOUNTER — Telehealth: Payer: Self-pay | Admitting: Family Medicine

## 2019-02-07 ENCOUNTER — Other Ambulatory Visit: Payer: Self-pay

## 2019-02-07 DIAGNOSIS — I10 Essential (primary) hypertension: Secondary | ICD-10-CM

## 2019-02-07 MED ORDER — LISINOPRIL-HYDROCHLOROTHIAZIDE 20-12.5 MG PO TABS: 2.0000 | ORAL_TABLET | Freq: Every day | ORAL | 0 refills | Status: DC

## 2019-02-07 NOTE — Telephone Encounter (Signed)
Done and pt  Is aware. [place]

## 2019-02-07 NOTE — Telephone Encounter (Signed)
Pt called and is requesting a refill on his lisinopril-hctz pt needs it to go to the [facility], [address] AT [address]

## 2019-02-07 NOTE — Telephone Encounter (Signed)
Pt called and states that he is still having the numbness in his arm and the medicine had helped but not enough he is wanting to know if there was something else he could take, pt uses [facility], [address] AT [address]

## 2019-02-07 NOTE — Telephone Encounter (Signed)
Lets get him in for an appointment.  Let him know that it might be time for an MRI

## 2019-02-09 ENCOUNTER — Ambulatory Visit (INDEPENDENT_AMBULATORY_CARE_PROVIDER_SITE_OTHER): Payer: BC Managed Care – PPO | Admitting: Family Medicine

## 2019-02-09 ENCOUNTER — Encounter: Payer: Self-pay | Admitting: Family Medicine

## 2019-02-09 ENCOUNTER — Other Ambulatory Visit: Payer: Self-pay

## 2019-02-09 VITALS — BP 126/80 | HR 80 | Temp 96.2°F | Wt 168.4 lb

## 2019-02-09 DIAGNOSIS — M5412 Radiculopathy, cervical region: Secondary | ICD-10-CM | POA: Diagnosis not present

## 2019-02-09 MED ORDER — PREDNISONE 10 MG (48) PO TBPK: ORAL_TABLET | ORAL | 0 refills | Status: DC

## 2019-02-09 NOTE — Progress Notes (Signed)
   Subjective:    Patient ID: Cameron Schmitt, male    DOB: 1960-10-26, 58 y.o.   MRN: [id number]  HPI He is here for recheck on his neck and arm discomfort.  He started on Aleve and was switched to diclofenac.  He states that he is roughly 50% better but still does get a tingling sensation down his right arm especially when he extends his neck.  He does have a previous history of MRI with some degenerative changes.   Review of Systems     Objective:   Physical Exam Alert and in no distress.  Flexion of the neck did not cause a numb sensation down to his entire hand.  Normal motor, sensory and DTRs as noted.       Assessment & Plan:  Cervical radiculopathy - Plan: predniSONE (STERAPRED UNI-PAK 48 TAB) 10 MG (48) TBPK tablet He is to call me when he finishes the course of the antibiotic.  If he continues have difficulty, I will set him up for an MRI.  Discussed possible epidural injection versus surgery.

## 2019-02-09 NOTE — Patient Instructions (Signed)
Take the steroid Dosepak and call me to let me know how you are doing when you finish it

## 2019-04-13 ENCOUNTER — Other Ambulatory Visit: Payer: Self-pay | Admitting: Family Medicine

## 2019-04-13 DIAGNOSIS — I10 Essential (primary) hypertension: Secondary | ICD-10-CM

## 2019-04-26 ENCOUNTER — Other Ambulatory Visit: Payer: Self-pay

## 2019-04-26 DIAGNOSIS — Z20822 Contact with and (suspected) exposure to covid-19: Secondary | ICD-10-CM

## 2019-04-28 LAB — NOVEL CORONAVIRUS, NAA: SARS-CoV-2, NAA: DETECTED — AB

## 2019-05-05 ENCOUNTER — Encounter: Payer: Self-pay | Admitting: Family Medicine

## 2019-05-05 ENCOUNTER — Ambulatory Visit (INDEPENDENT_AMBULATORY_CARE_PROVIDER_SITE_OTHER): Payer: BC Managed Care – PPO | Admitting: Family Medicine

## 2019-05-05 ENCOUNTER — Other Ambulatory Visit: Payer: Self-pay

## 2019-05-05 VITALS — HR 70 | Wt 162.0 lb

## 2019-05-05 DIAGNOSIS — U071 COVID-19: Secondary | ICD-10-CM

## 2019-05-05 NOTE — Progress Notes (Signed)
   Subjective:    Patient ID: Cameron Schmitt, male    DOB: 04-11-1961, 58 y.o.   MRN: [id number]  HPI Documentation for virtual telephone encounter. Documentation for virtual audio and video telecommunications through [place] encounter: The patient was located at home. The provider was located in the office. The patient did consent to this visit and is aware of possible charges through their insurance for this visit. The other persons participating in this telemedicine service were none. This virtual service is not related to other E/M service within previous 7 days. He started having difficulty with cough, congestion, rhinorrhea and then eventually loss of sense of smell.  The symptoms started on December 7.  He was tested on December 8 and was positive.  Since then the cough and congestion and sense of smell have returned to normal.  He has not had any symptoms for the last several days.    Review of Systems     Objective:   Physical Exam Alert and in no distress otherwise not examined      Assessment & Plan:  COVID-19 Since he is now asymptomatic and will be 10 days down the road as of tomorrow, I will give him a note stating that.  He will be sent to [email or web address]

## 2019-05-06 ENCOUNTER — Other Ambulatory Visit: Payer: Self-pay | Admitting: Family Medicine

## 2019-05-06 DIAGNOSIS — I10 Essential (primary) hypertension: Secondary | ICD-10-CM

## 2019-05-06 NOTE — Telephone Encounter (Signed)
Pt was advised he needed an appt within 30 days either virtual med check or in office to continue to get med

## 2019-06-01 DIAGNOSIS — H524 Presbyopia: Secondary | ICD-10-CM | POA: Diagnosis not present

## 2019-06-03 ENCOUNTER — Other Ambulatory Visit: Payer: Self-pay | Admitting: Family Medicine

## 2019-06-03 DIAGNOSIS — I10 Essential (primary) hypertension: Secondary | ICD-10-CM

## 2019-09-01 ENCOUNTER — Other Ambulatory Visit: Payer: Self-pay | Admitting: Family Medicine

## 2019-09-01 DIAGNOSIS — I10 Essential (primary) hypertension: Secondary | ICD-10-CM

## 2019-09-19 DIAGNOSIS — H40013 Open angle with borderline findings, low risk, bilateral: Secondary | ICD-10-CM | POA: Diagnosis not present

## 2019-10-02 ENCOUNTER — Other Ambulatory Visit: Payer: Self-pay | Admitting: Family Medicine

## 2019-10-02 DIAGNOSIS — I10 Essential (primary) hypertension: Secondary | ICD-10-CM

## 2019-10-10 ENCOUNTER — Other Ambulatory Visit: Payer: Self-pay | Admitting: Family Medicine

## 2019-10-10 DIAGNOSIS — I10 Essential (primary) hypertension: Secondary | ICD-10-CM

## 2019-10-19 ENCOUNTER — Encounter: Payer: Self-pay | Admitting: Family Medicine

## 2019-10-19 ENCOUNTER — Ambulatory Visit (INDEPENDENT_AMBULATORY_CARE_PROVIDER_SITE_OTHER): Payer: BC Managed Care – PPO | Admitting: Family Medicine

## 2019-10-19 ENCOUNTER — Other Ambulatory Visit: Payer: Self-pay

## 2019-10-19 VITALS — BP 126/84 | HR 71 | Temp 97.8°F | Ht 67.0 in | Wt 153.4 lb

## 2019-10-19 DIAGNOSIS — Z0000 Encounter for general adult medical examination without abnormal findings: Secondary | ICD-10-CM | POA: Diagnosis not present

## 2019-10-19 DIAGNOSIS — Z8619 Personal history of other infectious and parasitic diseases: Secondary | ICD-10-CM

## 2019-10-19 DIAGNOSIS — Z23 Encounter for immunization: Secondary | ICD-10-CM

## 2019-10-19 DIAGNOSIS — I10 Essential (primary) hypertension: Secondary | ICD-10-CM

## 2019-10-19 DIAGNOSIS — K4090 Unilateral inguinal hernia, without obstruction or gangrene, not specified as recurrent: Secondary | ICD-10-CM | POA: Diagnosis not present

## 2019-10-19 MED ORDER — AMLODIPINE BESYLATE 5 MG PO TABS: 5.0000 mg | ORAL_TABLET | Freq: Every day | ORAL | 3 refills | Status: DC

## 2019-10-19 MED ORDER — LISINOPRIL-HYDROCHLOROTHIAZIDE 20-12.5 MG PO TABS: 2.0000 | ORAL_TABLET | Freq: Every day | ORAL | 3 refills | Status: DC

## 2019-10-19 NOTE — Progress Notes (Signed)
  Subjective:    Patient ID: Cameron Schmitt, male    DOB: Aug 11, 1960, 59 y.o.   MRN: [id number] He is here for complete examination.  He does have an inguinal hernia and is chosen not to have any surgery on that.  He continues on his amlodipine and lisinopril/HCTZ for his blood pressure.  He is now started to take a multivitamin.  He keeps himself quite physically active.  He does not smoke and is not drinking at all at the present time.  His marriage is going well.  He recently graduated and is looking for a job in the healthcare profession.  Family and social history as well as health maintenance and immunizations was reviewed. Review of systems is negative except as above    Objective:    Physical Exam   Alert and in no distress. Tympanic membranes and canals are normal. Pharyngeal area is normal. Neck is supple without adenopathy or thyromegaly. Cardiac exam shows a regular sinus rhythm without murmurs or gallops. Lungs are clear to auscultation.  Abdominal exam does show a 4 cm left inguinal hernia.  Assessment & Plan:    Routine general medical examination at a health care facility - Plan: CBC with Differential/Platelet, Comprehensive metabolic panel, Lipid panel  Essential hypertension - Plan: CBC with Differential/Platelet, Comprehensive metabolic panel, amLODipine (NORVASC) 5 MG tablet, lisinopril-hydrochlorothiazide (ZESTORETIC) 20-12.5 MG tablet  Left inguinal hernia  Need for Tdap vaccination - Plan: Tdap vaccine greater than or equal to 7yo IM  History of shingles I discussed the hernia repair with him and at this point he is comfortable with waiting.  His immunizations were updated.  Discussed the fact that when he turns 59 I would still recommend getting the shingles vaccine.  He was comfortable with that.

## 2019-10-20 LAB — CBC WITH DIFFERENTIAL/PLATELET
Basophils Absolute: 0 10*3/uL (ref 0.0–0.2)
Basos: 1 %
EOS (ABSOLUTE): 0.1 10*3/uL (ref 0.0–0.4)
Eos: 3 %
Hematocrit: 42.4 % (ref 37.5–51.0)
Hemoglobin: 13.9 g/dL (ref 13.0–17.7)
Immature Grans (Abs): 0 10*3/uL (ref 0.0–0.1)
Immature Granulocytes: 0 %
Lymphocytes Absolute: 1.1 10*3/uL (ref 0.7–3.1)
Lymphs: 37 %
MCH: 26.2 pg — ABNORMAL LOW (ref 26.6–33.0)
MCHC: 32.8 g/dL (ref 31.5–35.7)
MCV: 80 fL (ref 79–97)
Monocytes Absolute: 0.4 10*3/uL (ref 0.1–0.9)
Monocytes: 12 %
Neutrophils Absolute: 1.4 10*3/uL (ref 1.4–7.0)
Neutrophils: 47 %
Platelets: 284 10*3/uL (ref 150–450)
RBC: 5.3 x10E6/uL (ref 4.14–5.80)
RDW: 12.7 % (ref 11.6–15.4)
WBC: 2.9 10*3/uL — ABNORMAL LOW (ref 3.4–10.8)

## 2019-10-20 LAB — COMPREHENSIVE METABOLIC PANEL
ALT: 29 IU/L (ref 0–44)
AST: 27 IU/L (ref 0–40)
Albumin/Globulin Ratio: 1.4 (ref 1.2–2.2)
Albumin: 4.8 g/dL (ref 3.8–4.9)
Alkaline Phosphatase: 67 IU/L (ref 48–121)
BUN/Creatinine Ratio: 14 (ref 9–20)
BUN: 15 mg/dL (ref 6–24)
Bilirubin Total: 0.7 mg/dL (ref 0.0–1.2)
CO2: 29 mmol/L (ref 20–29)
Calcium: 10 mg/dL (ref 8.7–10.2)
Chloride: 98 mmol/L (ref 96–106)
Creatinine, Ser: 1.11 mg/dL (ref 0.76–1.27)
GFR calc Af Amer: 84 mL/min/{1.73_m2} (ref >59)
GFR calc non Af Amer: 72 mL/min/{1.73_m2} (ref >59)
Globulin, Total: 3.4 g/dL (ref 1.5–4.5)
Glucose: 91 mg/dL (ref 65–99)
Potassium: 4.2 mmol/L (ref 3.5–5.2)
Sodium: 140 mmol/L (ref 134–144)
Total Protein: 8.2 g/dL (ref 6.0–8.5)

## 2019-10-20 LAB — LIPID PANEL
Chol/HDL Ratio: 4.7 ratio (ref 0.0–5.0)
Cholesterol, Total: 225 mg/dL — ABNORMAL HIGH (ref 100–199)
HDL: 48 mg/dL (ref >39)
LDL Chol Calc (NIH): 166 mg/dL — ABNORMAL HIGH (ref 0–99)
Triglycerides: 62 mg/dL (ref 0–149)
VLDL Cholesterol Cal: 11 mg/dL (ref 5–40)

## 2019-11-04 ENCOUNTER — Other Ambulatory Visit: Payer: Self-pay | Admitting: Family Medicine

## 2019-11-04 DIAGNOSIS — I10 Essential (primary) hypertension: Secondary | ICD-10-CM

## 2020-01-14 ENCOUNTER — Other Ambulatory Visit: Payer: Self-pay | Admitting: Family Medicine

## 2020-01-14 DIAGNOSIS — I10 Essential (primary) hypertension: Secondary | ICD-10-CM

## 2020-04-01 ENCOUNTER — Other Ambulatory Visit: Payer: Self-pay | Admitting: Family Medicine

## 2020-04-01 DIAGNOSIS — I10 Essential (primary) hypertension: Secondary | ICD-10-CM

## 2020-06-19 DIAGNOSIS — R7309 Other abnormal glucose: Secondary | ICD-10-CM | POA: Diagnosis not present

## 2020-07-17 DIAGNOSIS — R7309 Other abnormal glucose: Secondary | ICD-10-CM | POA: Diagnosis not present

## 2020-07-29 ENCOUNTER — Other Ambulatory Visit: Payer: Self-pay | Admitting: Family Medicine

## 2020-07-29 DIAGNOSIS — I10 Essential (primary) hypertension: Secondary | ICD-10-CM

## 2020-07-30 ENCOUNTER — Other Ambulatory Visit: Payer: Self-pay | Admitting: Family Medicine

## 2020-07-30 DIAGNOSIS — I10 Essential (primary) hypertension: Secondary | ICD-10-CM

## 2020-08-17 DIAGNOSIS — R7309 Other abnormal glucose: Secondary | ICD-10-CM | POA: Diagnosis not present

## 2020-09-16 DIAGNOSIS — R7309 Other abnormal glucose: Secondary | ICD-10-CM | POA: Diagnosis not present

## 2020-10-08 ENCOUNTER — Other Ambulatory Visit: Payer: Self-pay | Admitting: Family Medicine

## 2020-10-08 DIAGNOSIS — I10 Essential (primary) hypertension: Secondary | ICD-10-CM

## 2020-10-17 DIAGNOSIS — R7309 Other abnormal glucose: Secondary | ICD-10-CM | POA: Diagnosis not present

## 2020-10-19 ENCOUNTER — Encounter: Payer: BC Managed Care – PPO | Admitting: Family Medicine

## 2020-10-28 ENCOUNTER — Other Ambulatory Visit: Payer: Self-pay | Admitting: Family Medicine

## 2020-10-28 DIAGNOSIS — I10 Essential (primary) hypertension: Secondary | ICD-10-CM

## 2020-10-29 ENCOUNTER — Encounter: Payer: BC Managed Care – PPO | Admitting: Family Medicine

## 2020-10-30 ENCOUNTER — Encounter: Payer: Self-pay | Admitting: Family Medicine

## 2020-11-16 DIAGNOSIS — R7309 Other abnormal glucose: Secondary | ICD-10-CM | POA: Diagnosis not present

## 2020-12-17 DIAGNOSIS — R7309 Other abnormal glucose: Secondary | ICD-10-CM | POA: Diagnosis not present

## 2021-01-11 ENCOUNTER — Other Ambulatory Visit: Payer: Self-pay | Admitting: Medical

## 2021-01-11 ENCOUNTER — Other Ambulatory Visit: Payer: Self-pay | Admitting: Family Medicine

## 2021-01-11 DIAGNOSIS — I10 Essential (primary) hypertension: Secondary | ICD-10-CM

## 2021-01-11 NOTE — Telephone Encounter (Signed)
Got pt scheduled for 9/20. He said he works a lot and haven't been able to get here for an appt. I wrote myself a note to call pt and remind him of his appt

## 2021-01-11 NOTE — Telephone Encounter (Signed)
Last visit more than 12 months ago for blood pressure follow-up.  He needs an appointment now.  Please schedule  Find out why he has not been in sooner.  I may send 30-day supply, but let me know first

## 2021-01-11 NOTE — Telephone Encounter (Signed)
Received refill request for pts. Amlodipine last apt was 10/19/19 and has no future apts.

## 2021-01-17 DIAGNOSIS — R7309 Other abnormal glucose: Secondary | ICD-10-CM | POA: Diagnosis not present

## 2021-02-01 ENCOUNTER — Other Ambulatory Visit: Payer: Self-pay | Admitting: Family Medicine

## 2021-02-01 DIAGNOSIS — I10 Essential (primary) hypertension: Secondary | ICD-10-CM

## 2021-02-05 ENCOUNTER — Encounter: Payer: Self-pay | Admitting: Family Medicine

## 2021-02-05 ENCOUNTER — Other Ambulatory Visit: Payer: Self-pay

## 2021-02-05 ENCOUNTER — Ambulatory Visit (INDEPENDENT_AMBULATORY_CARE_PROVIDER_SITE_OTHER): Payer: BC Managed Care – PPO | Admitting: Family Medicine

## 2021-02-05 VITALS — BP 128/82 | HR 81 | Temp 96.2°F | Ht 67.0 in | Wt 152.8 lb

## 2021-02-05 DIAGNOSIS — Z8619 Personal history of other infectious and parasitic diseases: Secondary | ICD-10-CM

## 2021-02-05 DIAGNOSIS — Z0000 Encounter for general adult medical examination without abnormal findings: Secondary | ICD-10-CM

## 2021-02-05 DIAGNOSIS — D709 Neutropenia, unspecified: Secondary | ICD-10-CM

## 2021-02-05 DIAGNOSIS — I10 Essential (primary) hypertension: Secondary | ICD-10-CM | POA: Diagnosis not present

## 2021-02-05 DIAGNOSIS — Z23 Encounter for immunization: Secondary | ICD-10-CM

## 2021-02-05 LAB — COMPREHENSIVE METABOLIC PANEL
ALT: 26 IU/L (ref 0–44)
AST: 22 IU/L (ref 0–40)
Albumin/Globulin Ratio: 1.4 (ref 1.2–2.2)
Albumin: 4.5 g/dL (ref 3.8–4.8)
Alkaline Phosphatase: 63 IU/L (ref 44–121)
BUN/Creatinine Ratio: 15 (ref 10–24)
BUN: 17 mg/dL (ref 8–27)
Bilirubin Total: 0.6 mg/dL (ref 0.0–1.2)
CO2: 27 mmol/L (ref 20–29)
Calcium: 10 mg/dL (ref 8.6–10.2)
Chloride: 97 mmol/L (ref 96–106)
Creatinine, Ser: 1.15 mg/dL (ref 0.76–1.27)
Globulin, Total: 3.2 g/dL (ref 1.5–4.5)
Glucose: 83 mg/dL (ref 65–99)
Potassium: 4.1 mmol/L (ref 3.5–5.2)
Sodium: 138 mmol/L (ref 134–144)
Total Protein: 7.7 g/dL (ref 6.0–8.5)
eGFR: 72 mL/min/{1.73_m2} (ref >59)

## 2021-02-05 LAB — CBC WITH DIFFERENTIAL/PLATELET
Basophils Absolute: 0 10*3/uL (ref 0.0–0.2)
Basos: 1 %
EOS (ABSOLUTE): 0.1 10*3/uL (ref 0.0–0.4)
Eos: 5 %
Hematocrit: 42.4 % (ref 37.5–51.0)
Hemoglobin: 13.6 g/dL (ref 13.0–17.7)
Immature Grans (Abs): 0 10*3/uL (ref 0.0–0.1)
Immature Granulocytes: 0 %
Lymphocytes Absolute: 0.8 10*3/uL (ref 0.7–3.1)
Lymphs: 34 %
MCH: 25.9 pg — ABNORMAL LOW (ref 26.6–33.0)
MCHC: 32.1 g/dL (ref 31.5–35.7)
MCV: 81 fL (ref 79–97)
Monocytes Absolute: 0.4 10*3/uL (ref 0.1–0.9)
Monocytes: 15 %
Neutrophils Absolute: 1.1 10*3/uL — ABNORMAL LOW (ref 1.4–7.0)
Neutrophils: 45 %
Platelets: 234 10*3/uL (ref 150–450)
RBC: 5.25 x10E6/uL (ref 4.14–5.80)
RDW: 12.9 % (ref 11.6–15.4)
WBC: 2.4 10*3/uL — CL (ref 3.4–10.8)

## 2021-02-05 LAB — LIPID PANEL
Chol/HDL Ratio: 4.2 ratio (ref 0.0–5.0)
Cholesterol, Total: 199 mg/dL (ref 100–199)
HDL: 47 mg/dL (ref >39)
LDL Chol Calc (NIH): 142 mg/dL — ABNORMAL HIGH (ref 0–99)
Triglycerides: 55 mg/dL (ref 0–149)
VLDL Cholesterol Cal: 10 mg/dL (ref 5–40)

## 2021-02-05 MED ORDER — LISINOPRIL-HYDROCHLOROTHIAZIDE 20-12.5 MG PO TABS: 2.0000 | ORAL_TABLET | Freq: Every day | ORAL | 3 refills | Status: DC

## 2021-02-05 MED ORDER — AMLODIPINE BESYLATE 5 MG PO TABS: 5.0000 mg | ORAL_TABLET | Freq: Every day | ORAL | 3 refills | Status: DC

## 2021-02-05 NOTE — Progress Notes (Signed)
   Subjective:    Patient ID: Cameron Schmitt, male    DOB: 1960-10-23, 60 y.o.   MRN: [id number]  HPI He is here for complete examination.  He does have underlying hypertension and continues on lisinopril/HCTZ and amlodipine without difficulty.  He does have a inguinal hernia but has no desire to have surgery at the present time.  Does have a previous history of neutropenia as well as history of shingles.  He has been married for 8 years and this is going well.  Work is going well for him.  He has no other concerns or complaints.  Family and social history as well as health maintenance and immunizations was reviewed.   Review of Systems  All other systems reviewed and are negative.     Objective:   Physical Exam Alert and in no distress. Tympanic membranes and canals are normal. Pharyngeal area is normal. Neck is supple without adenopathy or thyromegaly. Cardiac exam shows a regular sinus rhythm without murmurs or gallops. Lungs are clear to auscultation.  Abdominal exam shows no masses or tenderness.        Assessment & Plan:  Routine general medical examination at a health care facility - Plan: CBC with Differential/Platelet, Comprehensive metabolic panel, Lipid panel  Essential hypertension - Plan: CBC with Differential/Platelet, Comprehensive metabolic panel, amLODipine (NORVASC) 5 MG tablet, lisinopril-hydrochlorothiazide (ZESTORETIC) 20-12.5 MG tablet  History of shingles  Neutropenia, unspecified type (HCC) - Plan: CBC with Differential/Platelet  Need for shingles vaccine - Plan: Varicella-zoster vaccine IM I encouraged him to continue to take excellent care of himself.

## 2021-02-06 NOTE — Addendum Note (Signed)
Addended by: Ronnald Nian on: 02/06/2021 08:19 AM   Modules accepted: Orders

## 2021-02-16 DIAGNOSIS — R7309 Other abnormal glucose: Secondary | ICD-10-CM | POA: Diagnosis not present

## 2021-03-04 DIAGNOSIS — H40013 Open angle with borderline findings, low risk, bilateral: Secondary | ICD-10-CM | POA: Diagnosis not present

## 2021-03-05 ENCOUNTER — Other Ambulatory Visit: Payer: Self-pay

## 2021-03-05 ENCOUNTER — Ambulatory Visit (INDEPENDENT_AMBULATORY_CARE_PROVIDER_SITE_OTHER): Payer: BC Managed Care – PPO | Admitting: Family Medicine

## 2021-03-05 VITALS — BP 114/78 | HR 75 | Temp 97.6°F | Wt 156.4 lb

## 2021-03-05 DIAGNOSIS — M545 Low back pain, unspecified: Secondary | ICD-10-CM | POA: Diagnosis not present

## 2021-03-05 NOTE — Progress Notes (Signed)
   Subjective:    Patient ID: Cameron Schmitt, male    DOB: 14-Apr-1961, 60 y.o.   MRN: [id number]  HPI He complains of a 3-week history of left-sided low back pain.  No history of injury or overuse.  Flexion does decrease the pain and extension does make it worse.  No numbness, tingling, weakness.  He has occasionally used Biofreeze and Advil or 2 periodically.  No previous history of difficulty with back pain.   Review of Systems     Objective:   Physical Exam Back exam shows no tenderness palpation.  Normal lumbar curve and motion.  Negative straight leg raising with normal DTRs.       Assessment & Plan:  Acute left-sided low back pain without sciatica I explained that I thought this was musculoskeletal in nature and treated conservatively with heat, stretching, proper lifting standing and sleeping.  Information was given concerning that.  If continued difficulty he will call for a recheck.  He was comfortable with that.

## 2021-03-05 NOTE — Patient Instructions (Signed)
Acute Back Pain, Adult Acute back pain is sudden and usually short-lived. It is often caused by an injury to the muscles and tissues in the back. The injury may result from: A muscle, tendon, or ligament getting overstretched or torn. Ligaments are tissues that connect bones to each other. Lifting something improperly can cause a back strain. Wear and tear (degeneration) of the spinal disks. Spinal disks are circular tissue that provide cushioning between the bones of the spine (vertebrae). Twisting motions, such as while playing sports or doing yard work. A hit to the back. Arthritis. You may have a physical exam, lab tests, and imaging tests to find the cause of your pain. Acute back pain usually goes away with rest and home care. Follow these instructions at home: Managing pain, stiffness, and swelling Take over-the-counter and prescription medicines only as told by your health care provider. Treatment may include medicines for pain and inflammation that are taken by mouth or applied to the skin, or muscle relaxants. Your health care provider may recommend applying ice during the first 24-48 hours after your pain starts. To do this: Put ice in a plastic bag. Place a towel between your skin and the bag. Leave the ice on for 20 minutes, 2-3 times a day. Remove the ice if your skin turns bright red. This is very important. If you cannot feel pain, heat, or cold, you have a greater risk of damage to the area. If directed, apply heat to the affected area as often as told by your health care provider. Use the heat source that your health care provider recommends, such as a moist heat pack or a heating pad. Place a towel between your skin and the heat source. Leave the heat on for 20-30 minutes. Remove the heat if your skin turns bright red. This is especially important if you are unable to feel pain, heat, or cold. You have a greater risk of getting burned. Activity  Do not stay in bed. Staying in  bed for more than 1-2 days can delay your recovery. Sit up and stand up straight. Avoid leaning forward when you sit or hunching over when you stand. If you work at a desk, sit close to it so you do not need to lean over. Keep your chin tucked in. Keep your neck drawn back, and keep your elbows bent at a 90-degree angle (right angle). Sit high and close to the steering wheel when you drive. Add lower back (lumbar) support to your car seat, if needed. Take short walks on even surfaces as soon as you are able. Try to increase the length of time you walk each day. Do not sit, drive, or stand in one place for more than 30 minutes at a time. Sitting or standing for long periods of time can put stress on your back. Do not drive or use heavy machinery while taking prescription pain medicine. Use proper lifting techniques. When you bend and lift, use positions that put less stress on your back: Bend your knees. Keep the load close to your body. Avoid twisting. Exercise regularly as told by your health care provider. Exercising helps your back heal faster and helps prevent back injuries by keeping muscles strong and flexible. Work with a physical therapist to make a safe exercise program, as recommended by your health care provider. Do any exercises as told by your physical therapist. Lifestyle Maintain a healthy weight. Extra weight puts stress on your back and makes it difficult to have good   posture. Avoid activities or situations that make you feel anxious or stressed. Stress and anxiety increase muscle tension and can make back pain worse. Learn ways to manage anxiety and stress, such as through exercise. General instructions Sleep on a firm mattress in a comfortable position. Try lying on your side with your knees slightly bent. If you lie on your back, put a pillow under your knees. Keep your head and neck in a straight line with your spine (neutral position) when using electronic equipment like  smartphones or pads. To do this: Raise your smartphone or pad to look at it instead of bending your head or neck to look down. Put the smartphone or pad at the level of your face while looking at the screen. Follow your treatment plan as told by your health care provider. This may include: Cognitive or behavioral therapy. Acupuncture or massage therapy. Meditation or yoga. Contact a health care provider if: You have pain that is not relieved with rest or medicine. You have increasing pain going down into your legs or buttocks. Your pain does not improve after 2 weeks. You have pain at night. You lose weight without trying. You have a fever or chills. You develop nausea or vomiting. You develop abdominal pain. Get help right away if: You develop new bowel or bladder control problems. You have unusual weakness or numbness in your arms or legs. You feel faint. These symptoms may represent a serious problem that is an emergency. Do not wait to see if the symptoms will go away. Get medical help right away. Call your local emergency services (911 in the [place] Do not drive yourself to the hospital. Summary Acute back pain is sudden and usually short-lived. Use proper lifting techniques. When you bend and lift, use positions that put less stress on your back. Take over-the-counter and prescription medicines only as told by your health care provider, and apply heat or ice as told. This information is not intended to replace advice given to you by your health care provider. Make sure you discuss any questions you have with your health care provider. Heat to your back for 20 minutes 3 times per day ;proper posturing.  4 ibuprofen 3 times per day for the next 10 days Document Revised: 07/27/2020 Document Reviewed: 07/27/2020 Elsevier Patient Education  2022 [employer].

## 2021-03-19 DIAGNOSIS — R7309 Other abnormal glucose: Secondary | ICD-10-CM | POA: Diagnosis not present

## 2021-04-08 ENCOUNTER — Other Ambulatory Visit (INDEPENDENT_AMBULATORY_CARE_PROVIDER_SITE_OTHER): Payer: BC Managed Care – PPO

## 2021-04-08 ENCOUNTER — Other Ambulatory Visit: Payer: Self-pay

## 2021-04-08 DIAGNOSIS — D709 Neutropenia, unspecified: Secondary | ICD-10-CM | POA: Diagnosis not present

## 2021-04-08 DIAGNOSIS — Z23 Encounter for immunization: Secondary | ICD-10-CM

## 2021-04-08 LAB — CBC WITH DIFFERENTIAL/PLATELET
Basophils Absolute: 0 10*3/uL (ref 0.0–0.2)
Basos: 1 %
EOS (ABSOLUTE): 0.1 10*3/uL (ref 0.0–0.4)
Eos: 4 %
Hematocrit: 44.2 % (ref 37.5–51.0)
Hemoglobin: 14.1 g/dL (ref 13.0–17.7)
Immature Grans (Abs): 0 10*3/uL (ref 0.0–0.1)
Immature Granulocytes: 0 %
Lymphocytes Absolute: 0.9 10*3/uL (ref 0.7–3.1)
Lymphs: 30 %
MCH: 25.4 pg — ABNORMAL LOW (ref 26.6–33.0)
MCHC: 31.9 g/dL (ref 31.5–35.7)
MCV: 80 fL (ref 79–97)
Monocytes Absolute: 0.4 10*3/uL (ref 0.1–0.9)
Monocytes: 13 %
Neutrophils Absolute: 1.5 10*3/uL (ref 1.4–7.0)
Neutrophils: 52 %
Platelets: 260 10*3/uL (ref 150–450)
RBC: 5.56 x10E6/uL (ref 4.14–5.80)
RDW: 12.5 % (ref 11.6–15.4)
WBC: 3 10*3/uL — ABNORMAL LOW (ref 3.4–10.8)

## 2021-04-18 DIAGNOSIS — R7309 Other abnormal glucose: Secondary | ICD-10-CM | POA: Diagnosis not present

## 2021-05-03 ENCOUNTER — Other Ambulatory Visit: Payer: Self-pay | Admitting: Family Medicine

## 2021-05-03 DIAGNOSIS — I10 Essential (primary) hypertension: Secondary | ICD-10-CM

## 2021-05-15 ENCOUNTER — Encounter: Payer: Self-pay | Admitting: Family Medicine

## 2021-05-15 ENCOUNTER — Other Ambulatory Visit: Payer: Self-pay

## 2021-05-15 ENCOUNTER — Ambulatory Visit (INDEPENDENT_AMBULATORY_CARE_PROVIDER_SITE_OTHER): Payer: BC Managed Care – PPO | Admitting: Family Medicine

## 2021-05-15 VITALS — BP 126/82 | HR 81 | Temp 98.3°F | Wt 157.8 lb

## 2021-05-15 DIAGNOSIS — M5090 Cervical disc disorder, unspecified, unspecified cervical region: Secondary | ICD-10-CM | POA: Diagnosis not present

## 2021-05-15 MED ORDER — PREDNISONE 10 MG (48) PO TBPK: ORAL_TABLET | ORAL | 0 refills | Status: DC

## 2021-05-15 NOTE — Progress Notes (Signed)
° °  Subjective:    Patient ID: Cameron Schmitt, male    DOB: [birth year], 60 y.o.   MRN: [id number]  HPI He complains of a several month history of right-sided neck pain that he notices especially when he rotates to the right and looks up.  He gets a tingling sensation going down to his fingers usually to the medial fingers.  Review of his record indicates he has had difficulty with this in the past with an MRI in 2014.  There was discussion of referral for epidural injections but apparently the symptoms went away.  Since then he is done fairly well until the last several months.   Review of Systems     Objective:   Physical Exam Full motion of the neck however with right lateral motion, he very quickly gets a tingling sensation going down into the fingers.  Normal sensory strength and DTRs. Review of the previous MRI does indicate that this is probably a C7 nerve root issue.       Assessment & Plan:  Cervical disc disease - Plan: predniSONE (STERAPRED UNI-PAK 48 TAB) 10 MG (48) TBPK tablet I discussed options with him concerning repeat x-rays, MRI, epidural injections.  We have decided to go with steroids to see if this will quiet down and if no improvement, he will set up an appointment to be rechecked to assess any further changes in his exam, get x-rays and MRI and probably refer for epidural.  He was comfortable with that.

## 2021-05-19 DIAGNOSIS — R7309 Other abnormal glucose: Secondary | ICD-10-CM | POA: Diagnosis not present

## 2021-06-05 ENCOUNTER — Encounter: Payer: Self-pay | Admitting: Family Medicine

## 2021-06-05 ENCOUNTER — Ambulatory Visit (INDEPENDENT_AMBULATORY_CARE_PROVIDER_SITE_OTHER): Payer: BC Managed Care – PPO | Admitting: Family Medicine

## 2021-06-05 ENCOUNTER — Other Ambulatory Visit: Payer: Self-pay

## 2021-06-05 VITALS — BP 138/80 | HR 89 | Temp 96.4°F | Wt 163.2 lb

## 2021-06-05 DIAGNOSIS — M5090 Cervical disc disorder, unspecified, unspecified cervical region: Secondary | ICD-10-CM

## 2021-06-05 NOTE — Progress Notes (Signed)
° °  Subjective:    Patient ID: Cameron Schmitt, male    DOB: [birth year], 61 y.o.   MRN: [id number]  HPI He is here for a recheck.  He has been on steroids and states that he is roughly 35 to 40% better but notes that when he looks up into the right he will get a tingling sensation in the ulnar nerve distribution to the fourth and fifth fingers.  He does have a previous history of difficulty with this with previous MRIs she did show a C7 nerve root impingement.   Review of Systems     Objective:   Physical Exam Tingling sensation noted when he bends his head backwards into the right tingling sensation into the fourth and fifth fingers.  Sensation and strength is normal.       Assessment & Plan:  Cervical disc disease - Plan: DG Cervical Spine Complete, MR Cervical Spine Wo Contrast I explained that with his previous history, we will get x-rays and order another MRI to assess the difference between his previous MRI and today.  Discussed the possibility of referring to PMR for possible epidural.

## 2021-06-14 ENCOUNTER — Ambulatory Visit
Admission: RE | Admit: 2021-06-14 | Discharge: 2021-06-14 | Disposition: A | Discharge status: Home / Self Care | Payer: Self-pay | Source: Ambulatory Visit | Attending: Family Medicine | Admitting: Family Medicine

## 2021-06-14 DIAGNOSIS — R202 Paresthesia of skin: Secondary | ICD-10-CM | POA: Diagnosis not present

## 2021-06-14 DIAGNOSIS — M542 Cervicalgia: Secondary | ICD-10-CM | POA: Diagnosis not present

## 2021-06-19 ENCOUNTER — Ambulatory Visit (INDEPENDENT_AMBULATORY_CARE_PROVIDER_SITE_OTHER): Payer: BC Managed Care – PPO | Admitting: Family Medicine

## 2021-06-19 ENCOUNTER — Other Ambulatory Visit: Payer: Self-pay

## 2021-06-19 ENCOUNTER — Encounter: Payer: Self-pay | Admitting: Family Medicine

## 2021-06-19 VITALS — BP 118/78 | HR 74 | Temp 96.4°F | Wt 162.8 lb

## 2021-06-19 DIAGNOSIS — K4090 Unilateral inguinal hernia, without obstruction or gangrene, not specified as recurrent: Secondary | ICD-10-CM | POA: Diagnosis not present

## 2021-06-19 DIAGNOSIS — R7309 Other abnormal glucose: Secondary | ICD-10-CM | POA: Diagnosis not present

## 2021-06-19 NOTE — Progress Notes (Signed)
° °  Subjective:    Patient ID: Cameron Schmitt, male    DOB: Oct 05, 1960, 61 y.o.   MRN: [id number]  HPI He is here for consult concerning left inguinal hernia.  It has been there for quite some time but is now starting to bother him more and he would like to get it taken care of.   Review of Systems     Objective:   Physical Exam Exam of the left inguinal area shows a 4 x 5 cm round smooth nontender lesion.  It was not amenable to manually decompress it.       Assessment & Plan:  Unilateral inguinal hernia without obstruction or gangrene, recurrence not specified - Plan: Ambulatory referral to General Surgery Explained that it is time to refer him to general surgery for care of this.

## 2021-06-29 ENCOUNTER — Ambulatory Visit
Admission: RE | Admit: 2021-06-29 | Discharge: 2021-06-29 | Disposition: A | Discharge status: Home / Self Care | Payer: BC Managed Care – PPO | Source: Ambulatory Visit | Attending: Family Medicine | Admitting: Family Medicine

## 2021-06-29 ENCOUNTER — Other Ambulatory Visit: Payer: Self-pay

## 2021-06-29 DIAGNOSIS — M2578 Osteophyte, vertebrae: Secondary | ICD-10-CM | POA: Diagnosis not present

## 2021-06-29 DIAGNOSIS — M47812 Spondylosis without myelopathy or radiculopathy, cervical region: Secondary | ICD-10-CM | POA: Diagnosis not present

## 2021-06-29 DIAGNOSIS — M4802 Spinal stenosis, cervical region: Secondary | ICD-10-CM | POA: Diagnosis not present

## 2021-07-01 NOTE — Addendum Note (Signed)
Addended by: Ronnald Nian on: 07/01/2021 08:38 AM   Modules accepted: Orders

## 2021-07-05 ENCOUNTER — Other Ambulatory Visit: Payer: Self-pay

## 2021-07-05 ENCOUNTER — Ambulatory Visit (INDEPENDENT_AMBULATORY_CARE_PROVIDER_SITE_OTHER): Payer: BC Managed Care – PPO | Admitting: Physical Medicine and Rehabilitation

## 2021-07-05 ENCOUNTER — Encounter: Payer: Self-pay | Admitting: Physical Medicine and Rehabilitation

## 2021-07-05 VITALS — BP 134/83 | HR 78

## 2021-07-05 DIAGNOSIS — M7918 Myalgia, other site: Secondary | ICD-10-CM

## 2021-07-05 DIAGNOSIS — R202 Paresthesia of skin: Secondary | ICD-10-CM

## 2021-07-05 DIAGNOSIS — M5412 Radiculopathy, cervical region: Secondary | ICD-10-CM

## 2021-07-05 DIAGNOSIS — M542 Cervicalgia: Secondary | ICD-10-CM

## 2021-07-05 NOTE — Progress Notes (Signed)
Cameron Schmitt - 61 y.o. male MRN [id number]  Date of birth: 10/03/1960  Office Visit Note: Visit Date: 07/05/2021 PCP: Denita Lung, MD Referred by: Denita Lung, MD  Subjective: Chief Complaint  Patient presents with   Neck - Tingling   Right Shoulder - Tingling   HPI: Cameron Schmitt is a 61 y.o. male who comes in today at the request of Dr. Jill Alexanders for evaluation of right sided numbness/tingling radiating down right arm to 3rd, 4th and 5th digits. Patient reports symptoms have been ongoing chronically for over 10 years, however became more severe over the last several months. Patient reports symptoms are exacerbated by movement, activity and specifically when looking up and to the right. He describes his symptoms as a numbness/tingling sensation, states his arm feels "asleep," currently rates as 5 out of 10.  He specifically denies that this is pain.  Patient reports some relief of symptoms with steroid medications in the past, also states he takes Ibuprofen intermittently. Patients recent cervical MRI exhibits mild diffuse spinal canal stenosis at L3-L4 through C6-C7, right paracentral disc protrusion at C4-C5, and severe right foraminal stenosis at C7. No high grade spinal canal stenosis noted. Patient states his symptoms are not severe at this time and describes his symptoms are annoying. Patient states he is very active and does work full time. Patient denies focal weakness, numbness and tingling. Patient denies recent trauma or falls.      Review of Systems  Musculoskeletal:  Positive for neck pain.  Neurological:  Positive for tingling. Negative for sensory change, focal weakness and weakness.  All other systems reviewed and are negative. Otherwise per HPI.  Assessment & Plan: Visit Diagnoses:    ICD-10-CM   1. Radiculopathy, cervical region  M54.12 Ambulatory referral to Physical Therapy    2. Cervicalgia  M54.2     3. Paresthesia of skin  R20.2 Ambulatory  referral to Physical Therapy    4. Myofascial pain syndrome  M79.18        Plan: Findings:  Chronic, worsening and severe numbness/tingling to right side of neck radiating down arm to 3rd, 4th and 5th digits. Patient continues to have numbness/tingling despite good conservative therapies such as rest and use of medications. Patients clinical presentation and exam are consistent with C7/C8 dermatome. We feel the next step is to place order for formal physical therapy with our in house team with a focus on cervical traction and myofascial release/trigger point management. At this time we feel that holding on cervical epidural steroid injection is the right choice as his discomfort is not severe. If patients symptoms persist we would consider performing diagnostic and hopefully therapeutic right C7-T1 interlaminar epidural steroid injection under fluoroscopic guidance. We will have patient follow up with [place] after several sessions of physical therapy. Patient encouraged to remain active. No red flag symptoms noted upon exam.    Meds & Orders: No orders of the defined types were placed in this encounter.   Orders Placed This Encounter  Procedures   Ambulatory referral to Physical Therapy    Follow-up: Return if symptoms worsen or fail to improve.   Procedures: No procedures performed      Clinical History: MRI CERVICAL SPINE WITHOUT CONTRAST   TECHNIQUE: Multiplanar, multisequence MR imaging of the cervical spine was performed. No intravenous contrast was administered.   COMPARISON:  MRI from 01/08/2013.   FINDINGS: Alignment: Straightening of the normal cervical lordosis. No listhesis.   Vertebrae: Vertebral body  height maintained without acute or chronic fracture. Bone marrow signal intensity diffusely heterogeneous without discrete or worrisome osseous lesion. No abnormal marrow edema.   Cord: Normal signal and morphology.   Posterior Fossa, vertebral arteries, paraspinal  tissues: Unremarkable.   Disc levels:   C2-C3: Shallow central disc protrusion mildly indents the ventral thecal sac. Mild facet spurring. No canal or foraminal stenosis.   C3-C4: Moderate intervertebral disc space narrowing, progressed from prior. Associated diffuse disc osteophyte complex with bilateral uncovertebral spurring. Flattening and partial effacement of the ventral thecal sac with mild spinal stenosis. Mild cord flattening without cord signal changes. Moderate left C4 foraminal stenosis. Right neural foramen remains patent.   C4-C5: Diffuse disc bulge with endplate and uncovertebral spurring. Superimposed right paracentral disc protrusion indents the ventral thecal sac (series 6, image 13). Minimal flattening of the ventral cord without cord signal changes. Mild spinal stenosis. Moderate bilateral C5 foraminal stenosis.   C5-C6: Mild intervertebral disc space narrowing with diffuse disc bulge. Associated endplate and uncovertebral spurring. Flattening of the ventral thecal sac with resultant mild spinal stenosis. Minimal cord flattening without cord signal changes. Moderate bilateral C6 foraminal narrowing.   C6-C7: Degenerative intervertebral disc space narrowing with circumferential disc osteophyte complex. Broad posterior component flattens and partially faces the ventral thecal sac with mild spinal stenosis. No cord impingement. Severe right worse than left C7 foraminal stenosis.   C7-T1: Mild disc bulge. Bilateral facet hypertrophy. No spinal stenosis. Mild to moderate bilateral C8 foraminal narrowing.   Visualized upper thoracic spine demonstrates no significant finding.   IMPRESSION: 1. Multilevel cervical spondylosis with resultant mild diffuse spinal stenosis at C3-4 through C6-7. Specific note made of a right paracentral disc protrusion at C4-5, which could potentially contribute to right-sided symptoms. 2. Multifactorial degenerative changes with  resultant multilevel foraminal narrowing as above. Notable findings include moderate left C4 foraminal stenosis, moderate bilateral C5 and C6 foraminal narrowing, with severe right worse than left C7 foraminal stenosis.     Electronically Signed   By: Jeannine Boga M.D.   On: 07/01/2021 02:54   He reports that he has never smoked. He has never used smokeless tobacco. No results for input(s): HGBA1C, LABURIC in the last 8760 hours.  Objective:  VS:  HT:     WT:    BMI:      BP:134/83   HR:78bpm   TEMP: ( )   RESP:  Physical Exam Vitals and nursing note reviewed.  HENT:     Head: Normocephalic and atraumatic.     Right Ear: External ear normal.     Left Ear: External ear normal.     Nose: Nose normal.     Mouth/Throat:     Mouth: Mucous membranes are moist.  Eyes:     Extraocular Movements: Extraocular movements intact.  Cardiovascular:     Rate and Rhythm: Normal rate.     Pulses: Normal pulses.  Pulmonary:     Effort: Pulmonary effort is normal.  Abdominal:     General: Abdomen is flat.  Musculoskeletal:        General: Tenderness present.     Cervical back: Tenderness present.     Comments: Pain noted when moving neck up and to the right. Patient has good strength in the upper extremities including 5 out of 5 strength in wrist extension, long finger flexion and APB. There is no atrophy of the hands intrinsically.  Sensation intact bilaterally. Tightness noted to right trapezius region upon palpation. Negative Hoffman's sign.  Equivocally positive Spurling's sign.    Skin:    General: Skin is warm and dry.     Capillary Refill: Capillary refill takes less than 2 seconds.  Neurological:     General: No focal deficit present.     Mental Status: He is alert and oriented to person, place, and time.  Psychiatric:        Mood and Affect: Mood normal.        Behavior: Behavior normal.    Ortho Exam  Imaging: No results found.  Past Medical/Family/Surgical/Social  History: Medications & Allergies reviewed per EMR, new medications updated. Patient Active Problem List   Diagnosis Date Noted   Left inguinal hernia 10/19/2019   History of shingles 10/19/2019   Neutropenia ([place]) 09/03/2011   Essential hypertension 09/03/2011   Past Medical History:  Diagnosis Date   Hypertension    Inguinal cyst    Leukopenia    Family History  Problem Relation Age of Onset   Diabetes Mother    Hyperlipidemia Mother    Hypertension Mother    Cancer Mother 36       Multiple myeloma   COPD Father    History reviewed. No pertinent surgical history. Social History   Occupational History   Not on file  Tobacco Use   Smoking status: Never   Smokeless tobacco: Never  Vaping Use   Vaping Use: Never used  Substance and Sexual Activity   Alcohol use: Not Currently    Alcohol/week: 0.0 standard drinks   Drug use: No   Sexual activity: Yes

## 2021-07-05 NOTE — Progress Notes (Signed)
Pt state neck tingling that travels down his right arm. Pt state turning his head and looking down to the can cause tingling. Pt state he doesn't take anything for the tingling.  Numeric Pain Rating Scale and Functional Assessment Average Pain 8 Pain Right Now 6 My pain is intermittent and tingling Pain is worse with:  turning his neck Pain improves with: rest   In the last MONTH (on 0-10 scale) has pain interfered with the following?  1. General activity like being  able to carry out your everyday physical activities such as walking, climbing stairs, carrying groceries, or moving a chair?  Rating(5)  2. Relation with others like being able to carry out your usual social activities and roles such as  activities at home, at work and in your community. Rating(6)  3. Enjoyment of life such that you have  been bothered by emotional problems such as feeling anxious, depressed or irritable?  Rating(7)

## 2021-07-10 ENCOUNTER — Ambulatory Visit: Payer: Self-pay | Admitting: General Surgery

## 2021-07-10 DIAGNOSIS — K4090 Unilateral inguinal hernia, without obstruction or gangrene, not specified as recurrent: Secondary | ICD-10-CM | POA: Diagnosis not present

## 2021-07-17 DIAGNOSIS — R7309 Other abnormal glucose: Secondary | ICD-10-CM | POA: Diagnosis not present

## 2021-07-24 ENCOUNTER — Encounter: Payer: Self-pay | Admitting: Rehabilitative and Restorative Service Providers"

## 2021-07-24 ENCOUNTER — Other Ambulatory Visit: Payer: Self-pay

## 2021-07-24 ENCOUNTER — Ambulatory Visit (INDEPENDENT_AMBULATORY_CARE_PROVIDER_SITE_OTHER): Payer: BC Managed Care – PPO | Admitting: Rehabilitative and Restorative Service Providers"

## 2021-07-24 DIAGNOSIS — M5412 Radiculopathy, cervical region: Secondary | ICD-10-CM

## 2021-07-24 DIAGNOSIS — R600 Localized edema: Secondary | ICD-10-CM

## 2021-07-24 DIAGNOSIS — R293 Abnormal posture: Secondary | ICD-10-CM | POA: Diagnosis not present

## 2021-07-24 NOTE — Therapy (Signed)
?OUTPATIENT PHYSICAL THERAPY CERVICAL EVALUATION ? ? ?Patient Name: Cameron Schmitt ?MRN: [id number] ?DOB:04/13/1961, 61 y.o., male ?Today's Date: 07/24/2021 ? ? PT End of Session - 07/24/21 1558   ? ? Visit Number 1   ? Number of Visits 12   ? Date for PT Re-Evaluation 09/18/21   ? PT Start Time 1510   ? PT Stop Time 1557   ? PT Time Calculation (min) 47 min   ? Activity Tolerance Patient tolerated treatment well;No increased pain   ? Behavior During Therapy Cameron Schmitt   ? ?  ?  ? ?  ? ? ?Past Medical History:  ?Diagnosis Date  ? Hypertension   ? Inguinal cyst   ? Leukopenia   ? ?History reviewed. No pertinent surgical history. ?Patient Active Problem List  ? Diagnosis Date Noted  ? Left inguinal hernia 10/19/2019  ? History of shingles 10/19/2019  ? Neutropenia ([facility]) 09/03/2011  ? Essential hypertension 09/03/2011  ? ? ?PCP: Cameron NianLalonde, John C, MD ? ?REFERRING PROVIDER: Juanda ChanceWilliams, Megan E, NP ? ?REFERRING DIAG: M54.12 (ICD-10-CM) - Radiculopathy, cervical region  ? ?THERAPY DIAG:  ?Abnormal posture ? ?Radiculopathy, cervical region ? ?Localized edema ? ?ONSET DATE: Years in duration.  Most recent episode 3-4 months.   ? ?SUBJECTIVE:                                                                                                                                                                                                        ? ?SUBJECTIVE STATEMENT: ?Cameron SoursGreg notes R UE tingling into the hand when looking up and to the R.  This has improved since a steroid dose pack but is still noticeable.  He would like for this to resolve as this is the second time he has had these symptoms (1st time resolved with steroids only). ? ?PERTINENT HISTORY: HTN  ? ?PAIN:  ?Are you having pain? No ?NPRS scale: 0-3/10 ?Pain location: R hand to digits 4 and 5. ?Pain orientation: Right  ?PAIN TYPE: tingling ?Pain description: tingling and numb   ?Aggravating factors: Looking up and to the R ?Relieving factors: Change of  position ? ?PRECAUTIONS: Cervical ? ?WEIGHT BEARING RESTRICTIONS No ? ?FALLS:  ?Has patient fallen in last 6 months? No ?Number of falls: 0 ? 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 ? ?PLOF: Independent ? ?PATIENT GOALS Get rid of all tingling and get back to 100%. ? ?OBJECTIVE:  ? ?DIAGNOSTIC FINDINGS:  ?IMPRESSION: ?1. Multilevel cervical spondylosis with resultant mild diffuse ?spinal stenosis at  C3-4 through C6-7. Specific note made of a right ?paracentral disc protrusion at C4-5, which could potentially ?contribute to right-sided symptoms. ?2. Multifactorial degenerative changes with resultant multilevel ?foraminal narrowing as above. Notable findings include moderate left ?C4 foraminal stenosis, moderate bilateral C5 and C6 foraminal ?narrowing, with severe right worse than left C7 foraminal stenosis ? ?PATIENT SURVEYS:  ?FOTO 61 (Goal 75 in 9 visits) ? ? ?COGNITION: ?Overall cognitive status: Within functional limits for tasks assessed ?  ?POSTURE:  ?Forward head, internally rotated and protracted shoulders. ? ?CERVICAL AROM/PROM ? ?A/PROM AROM (deg) ?07/24/2021  ?Flexion   ?Extension 55  ?Right lateral flexion 35  ?Left lateral flexion 35  ?Right rotation 60  ?Left rotation 55  ? (Blank rows = not tested) ? ?Strength with hand-held dynamometer in pounds: ? ?Strength in pounds Right ?07/24/2021 Left ?07/24/2021  ?    ?Cervical extension 40.9 pounds   ?Cervical lateral bending 35.2 pounds 26.7 pounds  ? ?CERVICAL SPECIAL TESTS:  ?Spurling's test: Positive ? ? ?PATIENT SURVEYS:  ?FOTO 61 (Goal 75 in 9 visits) ? ?TODAY'S TREATMENT:  ?Cervical traction static 8 minutes 25# ? ?Therapeutic Activities: Review imaging, spine anatomy, exam findings, suggestions to modify posture with the phone and computer and review home walking and exercises ? ?Therapeutic exercises: ?Shoulder blade pinches 5X 5 seconds ? ?Cervical Extension Isometrics 10X 5 seconds ? ? ? ?PATIENT  EDUCATION:  ?Education details: See above ?Person educated: Patient ?Education method: Explanation, Demonstration, Tactile cues, Verbal cues, and Handouts ?Education comprehension: verbalized understanding, returned demonstration, verbal cues required, tactile cues required, and needs further education ? ? ?HOME EXERCISE PROGRAM: ?Access Code: [email or web address] ?URL: [email or web address] ?Date: 07/24/2021 ?Prepared by: Cameron Schmitt ? ?Exercises ?Standing Scapular Retraction - 5 x daily - 7 x weekly - 1 sets - 5 reps - 5 second hold ?Standing Isometric Cervical Extension with Manual Resistance - 5 x daily - 7 x weekly - 1 sets - 5 reps - 5 hold ? ?ASSESSMENT: ? ?CLINICAL IMPRESSION: ?Patient is a 61 y.o. M who was seen today for physical therapy evaluation and treatment for cervical radiculopathy.  AROM and strength are good, although posture and body mechanics witll benefit from skilled PT.  Cameron Schmitt will also benefit from continued cervical traction to help resolve peripheral symptoms. ? ? ?OBJECTIVE IMPAIRMENTS decreased activity tolerance, decreased endurance, decreased knowledge of condition, decreased safety awareness, increased edema, impaired perceived functional ability, improper body mechanics, and postural dysfunction.  ? ?ACTIVITY LIMITATIONS community activity and occupation.  ? ?PERSONAL FACTORS  The fact that this is the second exacerbation of this problem is also affecting patient's functional outcome.  ? ? ?REHAB POTENTIAL: Good ? ?CLINICAL DECISION MAKING: Stable/uncomplicated ? ?EVALUATION COMPLEXITY: Low ? ? ?GOALS: ?Goals reviewed with patient? Yes ? ?SHORT TERM GOALS: ? ?Cameron Schmitt will be able to demonstrate correct posture with sitting, standing and select functional activities (checking his phone). ?Baseline: Started today ?Target date: 08/21/2021 ?Goal status: INITIAL ? ?2.  Cameron Schmitt will be independent with his day 1 HEP. ?Baseline: Started today ?Target date: 08/21/2021 ?Goal status:  INITIAL ? ? ?LONG TERM GOALS: ? ?Improve FOTO to 75 in 9 visits. ?Baseline: 61 ?Target date: 09/18/2021 ?Goal status: INITIAL ? ?2.  Cameron Schmitt will report minimal to no R UE tingling (0-0.5/10 VAS) at DC. ?Baseline: Was 5/10, now 3/10 ?Target date: 09/18/2021 ?Goal status: INITIAL ? ?3.  Cameron Schmitt will be independent with his long-term HEP at DC. ?Baseline: Started today ?Target date: 09/18/2021 ?Goal status: INITIAL ? ?PLAN: ?PT FREQUENCY: 1-2x/week ? ?  PT DURATION: 8 weeks ? ?PLANNED INTERVENTIONS: Therapeutic exercises, Therapeutic activity, Neuromuscular re-education, Patient/Family education, Cryotherapy, Traction, and Manual therapy ? ?PLAN FOR NEXT SESSION: If OK from day 1 (expected), try 27-28# traction for 8-10 minutes.  Review HEP with additional postural, scapular strengthening and PRACTICAL posture and body mechanics work. ? ? ?Cherlyn Cushing, PT, MPT ?07/24/2021, 4:01 PM ? ? ? ?  ?

## 2021-08-05 ENCOUNTER — Encounter: Payer: Self-pay | Admitting: Physical Therapy

## 2021-08-05 ENCOUNTER — Other Ambulatory Visit: Payer: Self-pay

## 2021-08-05 ENCOUNTER — Ambulatory Visit (INDEPENDENT_AMBULATORY_CARE_PROVIDER_SITE_OTHER): Payer: BC Managed Care – PPO | Admitting: Physical Therapy

## 2021-08-05 DIAGNOSIS — R600 Localized edema: Secondary | ICD-10-CM | POA: Diagnosis not present

## 2021-08-05 DIAGNOSIS — R293 Abnormal posture: Secondary | ICD-10-CM

## 2021-08-05 DIAGNOSIS — M5412 Radiculopathy, cervical region: Secondary | ICD-10-CM | POA: Diagnosis not present

## 2021-08-05 NOTE — Therapy (Signed)
?OUTPATIENT PHYSICAL THERAPY TREATMENT NOTE ? ? ?Patient Name: Cameron Schmitt ?MRN: [id number] ?DOB:[birth year], 61 y.o., male ?Today's Date: 08/05/2021 ? ?PCP: Cameron Nian, MD ?REFERRING PROVIDER: Ronnald Nian, MD ? ? PT End of Session - 08/05/21 1423   ? ? Visit Number 2   ? Number of Visits 12   ? Date for PT Re-Evaluation 09/18/21   ? PT Start Time 1345   ? PT Stop Time 1425   ? PT Time Calculation (min) 40 min   ? Activity Tolerance Patient tolerated treatment well;No increased pain   ? Behavior During Therapy Cameron Schmitt for tasks assessed/performed   ? ?  ?  ? ?  ? ? ?Past Medical History:  ?Diagnosis Date  ? Hypertension   ? Inguinal cyst   ? Leukopenia   ? ?History reviewed. No pertinent surgical history. ?Patient Active Problem List  ? Diagnosis Date Noted  ? Left inguinal hernia 10/19/2019  ? History of shingles 10/19/2019  ? Neutropenia (HCC) 09/03/2011  ? Essential hypertension 09/03/2011  ?PCP: Cameron Nian, MD ?  ?REFERRING PROVIDER: Juanda Chance, NP ?  ?REFERRING DIAG: M54.12 (ICD-10-CM) - Radiculopathy, cervical region  ?  ?THERAPY DIAG:  ?Abnormal posture ?  ?Radiculopathy, cervical region ?  ?Localized edema ?  ?ONSET DATE: Years in duration.  Most recent episode 3-4 months.   ?  ?SUBJECTIVE:                                                                                                                                                                                                        ?  ?SUBJECTIVE STATEMENT: ?Cameron Schmitt notes some relief after traction last time. Overall less radicular symptoms reported now. ?  ?PAIN:  ?Are you having pain? No ?NPRS scale: 0-3/10 ?Pain location: R hand to digits 4 and 5. ?Pain orientation: Right  ?PAIN TYPE: tingling ?Pain description: tingling and numb   ?Aggravating factors: Looking up and to the R ?Relieving factors: Change of position ?  ?PRECAUTIONS: none ?  ?WEIGHT BEARING RESTRICTIONS No ?  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 ?  ?PATIENT GOALS Get rid of all tingling and get back to 100%. ?  ?OBJECTIVE:  ?  ?DIAGNOSTIC FINDINGS:  ?IMPRESSION: ?1. Multilevel cervical spondylosis with resultant mild diffuse ?spinal stenosis at C3-4 through C6-7. Specific note made of a right ?paracentral disc protrusion at C4-5, which could potentially ?contribute to right-sided symptoms. ?2. Multifactorial degenerative changes with resultant multilevel ?foraminal narrowing as above. Notable findings include moderate left ?  C4 foraminal stenosis, moderate bilateral C5 and C6 foraminal ?narrowing, with severe right worse than left C7 foraminal stenosis ?  ?PATIENT SURVEYS:  ?FOTO 61 (Goal 75 in 9 visits) ? ?           ?POSTURE:  ?Forward head, internally rotated and protracted shoulders. ?  ?CERVICAL AROM/PROM ?  ?A/PROM AROM (deg) ?07/24/2021  ?Flexion    ?Extension 55  ?Right lateral flexion 35  ?Left lateral flexion 35  ?Right rotation 60  ?Left rotation 55  ? (Blank rows = not tested) ?  ?Strength with hand-held dynamometer in pounds: ?  ?Strength in pounds Right ?07/24/2021 Left ?07/24/2021  ?       ?Cervical extension 40.9 pounds    ?Cervical lateral bending 35.2 pounds 26.7 pounds  ?  ?CERVICAL SPECIAL TESTS:  ?Spurling's test: Positive ?  ?  ?PATIENT SURVEYS:  ?FOTO 61 (Goal 75 in 9 visits) ?  ?TODAY'S TREATMENT:  ?08/05/21 ?Therapeutic Exercise: ? Aerobic: ?Supine: ?Prone: ? Seated:Lat pull machine 35# X20 ? Standing: Bilat shoulder Rows,extensions, Horizontal abd, and bilat ER all with with green X20 ea. Face pulls at cable machine with rope attachment 2X10  ?Neuromuscular Re-education: ?Manual Therapy: ?Therapeutic Activity: ?Self Care: ?Trigger Point Dry Needling:  ?Modalities: Cervical traction static 15 minutes 27# ? ?07/24/21 ?Cervical traction static 8 minutes 25# ?  ?Therapeutic Activities: Review imaging, spine anatomy, exam findings, suggestions to modify posture with the phone and computer and review home walking and  exercises ?  ?Therapeutic exercises: ?Shoulder blade pinches 5X 5 seconds ?  ?Cervical Extension Isometrics 10X 5 seconds ?  ?  ?  ?PATIENT EDUCATION:  ?Education details: See above ?Person educated: Patient ?Education method: Explanation, Demonstration, Tactile cues, Verbal cues, and Handouts ?Education comprehension: verbalized understanding, returned demonstration, verbal cues required, tactile cues required, and needs further education ?  ?  ?HOME EXERCISE PROGRAM: ?Access Code: [email or web address] ?URL: [email or web address] ?Date: 07/24/2021 ?Prepared by: Cameron Schmitt ?  ?Exercises ?Standing Scapular Retraction - 5 x daily - 7 x weekly - 1 sets - 5 reps - 5 second hold ?Standing Isometric Cervical Extension with Manual Resistance - 5 x daily - 7 x weekly - 1 sets - 5 reps - 5 hold ?  ?ASSESSMENT: ?  ?CLINICAL IMPRESSION: ?He showed good relief from cervical mechanical traction so this was again performed and progressed today with good results. He was not having radicular symptoms today so we progressed his strengthening program with good overall tolerance to this. He has been compliant with HEP as well which may need to be progressed some due to his progress. ?  ?  ?OBJECTIVE IMPAIRMENTS decreased activity tolerance, decreased endurance, decreased knowledge of condition, decreased safety awareness, increased edema, impaired perceived functional ability, improper body mechanics, and postural dysfunction.  ?  ?ACTIVITY LIMITATIONS community activity and occupation.  ?  ?PERSONAL FACTORS  The fact that this is the second exacerbation of this problem is also affecting patient's functional outcome.  ?  ?  ?REHAB POTENTIAL: Good ?  ?CLINICAL DECISION MAKING: Stable/uncomplicated ?  ?EVALUATION COMPLEXITY: Low ?  ?  ?GOALS: ?Goals reviewed with patient? Yes ?  ?SHORT TERM GOALS: ?  ?Cameron Schmitt will be able to demonstrate correct posture with sitting, standing and select functional activities (checking his  phone). ?Baseline: Started today ?Target date: 08/21/2021 ?Goal status: INITIAL ?  ?2.  Cameron Schmitt will be independent with his day 1 HEP. ?Baseline: Started today ?Target date: 08/21/2021 ?Goal status: INITIAL ?  ?  ?LONG TERM GOALS: ?  ?  Improve FOTO to 75 in 9 visits. ?Baseline: 61 ?Target date: 09/18/2021 ?Goal status: INITIAL ?  ?2.  Cameron SoursGreg will report minimal to no R UE tingling (0-0.5/10 VAS) at DC. ?Baseline: Was 5/10, now 3/10 ?Target date: 09/18/2021 ?Goal status: INITIAL ?  ?3.  Cameron SoursGreg will be independent with his long-term HEP at DC. ?Baseline: Started today ?Target date: 09/18/2021 ?Goal status: INITIAL ?  ?PLAN: ?PT FREQUENCY: 1-2x/week ?  ?PT DURATION: 8 weeks ?  ?PLANNED INTERVENTIONS: Therapeutic exercises, Therapeutic activity, Neuromuscular re-education, Patient/Family education, Cryotherapy, Traction, and Manual therapy ?  ?PLAN FOR NEXT SESSION: Continue traction if desired.  Review HEP with additional postural, scapular strengthening and PRACTICAL posture and body mechanics work. ? ? ? ?April MansonBrian R Brinleigh Tew, PT,DPT ?08/05/2021, 2:26 PM ? ?  ? ?

## 2021-08-08 ENCOUNTER — Other Ambulatory Visit: Payer: Self-pay

## 2021-08-08 ENCOUNTER — Ambulatory Visit (INDEPENDENT_AMBULATORY_CARE_PROVIDER_SITE_OTHER): Payer: BC Managed Care – PPO | Admitting: Rehabilitative and Restorative Service Providers"

## 2021-08-08 ENCOUNTER — Encounter: Payer: Self-pay | Admitting: Rehabilitative and Restorative Service Providers"

## 2021-08-08 DIAGNOSIS — M5412 Radiculopathy, cervical region: Secondary | ICD-10-CM | POA: Diagnosis not present

## 2021-08-08 DIAGNOSIS — R293 Abnormal posture: Secondary | ICD-10-CM

## 2021-08-08 DIAGNOSIS — R600 Localized edema: Secondary | ICD-10-CM

## 2021-08-08 NOTE — Therapy (Signed)
?OUTPATIENT PHYSICAL THERAPY TREATMENT NOTE ? ? ?Patient Name: Cameron Schmitt ?MRN: [id number] ?DOB:08/11/1960, 61 y.o., male ?Today's Date: 08/08/2021 ? ?PCP: Ronnald NianLalonde, John C, MD ?REFERRING PROVIDER: Juanda ChanceWilliams, Megan E, NP ? ? PT End of Session - 08/08/21 1437   ? ? Visit Number 3   ? Number of Visits 12   ? Date for PT Re-Evaluation 09/18/21   ? PT Start Time 1430   ? PT Stop Time 1510   ? PT Time Calculation (min) 40 min   ? Activity Tolerance Patient tolerated treatment well;No increased pain   ? Behavior [facility] for tasks assessed/performed   ? ?  ?  ? ?  ? ? ? ?Past Medical History:  ?Diagnosis Date  ? Hypertension   ? Inguinal cyst   ? Leukopenia   ? ?History reviewed. No pertinent surgical history. ?Patient Active Problem List  ? Diagnosis Date Noted  ? Left inguinal hernia 10/19/2019  ? History of shingles 10/19/2019  ? Neutropenia (HCC) 09/03/2011  ? Essential hypertension 09/03/2011  ?PCP: Ronnald NianLalonde, John C, MD ?  ?REFERRING PROVIDER: Juanda ChanceWilliams, Megan E, NP ?  ?REFERRING DIAG: M54.12 (ICD-10-CM) - Radiculopathy, cervical region  ?  ?THERAPY DIAG:  ?Abnormal posture ?  ?Radiculopathy, cervical region ?  ?Localized edema ?  ?ONSET DATE: Years in duration.  Most recent episode 3-4 months.   ?  ?SUBJECTIVE:                                                                                                                                                                                                        ?  ?SUBJECTIVE STATEMENT: ?Cameron SoursGreg notes he has not had any R sided radicular symptoms this week. ?  ?PAIN:  ?Are you having pain? No ?NPRS scale: 0/10 this week ?Pain location: R hand to digits 4 and 5. ?Pain orientation: Right  ?PAIN TYPE: tingling ?Pain description: tingling and numb   ?Aggravating factors: Looking up and to the R ?Relieving factors: Change of position ?  ?PRECAUTIONS: none ?  ?WEIGHT BEARING RESTRICTIONS No ?  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 ?  ?PATIENT GOALS Get rid of all tingling and get back to 100%. ?  ?OBJECTIVE:  ?  ?DIAGNOSTIC FINDINGS:  ?IMPRESSION: ?1. Multilevel cervical spondylosis with resultant mild diffuse ?spinal stenosis at C3-4 through C6-7. Specific note made of a right ?paracentral disc protrusion at C4-5, which could potentially ?contribute to right-sided symptoms. ?2. Multifactorial degenerative changes with resultant multilevel ?foraminal narrowing as above. Notable findings include  moderate left ?C4 foraminal stenosis, moderate bilateral C5 and C6 foraminal ?narrowing, with severe right worse than left C7 foraminal stenosis ?  ?PATIENT SURVEYS:  ?FOTO 61 (Goal 75 in 9 visits) ? ?           ?POSTURE:  ?Forward head, internally rotated and protracted shoulders. ?  ?CERVICAL AROM/PROM ?  ?A/PROM AROM (deg) ?07/24/2021  ?Flexion    ?Extension 55  ?Right lateral flexion 35  ?Left lateral flexion 35  ?Right rotation 60  ?Left rotation 55  ? (Blank rows = not tested) ?  ?Strength with hand-held dynamometer in pounds: ?  ?Strength in pounds Right ?07/24/2021 Left ?07/24/2021  ?       ?Cervical extension 40.9 pounds    ?Cervical lateral bending 35.2 pounds 26.7 pounds  ?  ?CERVICAL SPECIAL TESTS:  ?Spurling's test: Positive ?  ?  ?PATIENT SURVEYS:  ?FOTO 61 (Goal 75 in 9 visits) ?  ?TODAY'S TREATMENT:  ?08/08/21 ?Cervical traction static 8 minutes 27# ?  ?Therapeutic Activities: Review imaging, spine anatomy, suggestions to modify posture with the phone, computer, work and workout activities and review home walking and exercises ?  ?Therapeutic exercises: ?Shoulder blade pinches 5X 5 seconds ?  ?Pull to chest Blue theraband 20X ? ?Shoulder ER Theraband Green 10X B ? ?Cervical Extension Isometrics 10X 5 seconds ? ?Prone 90 & 100 degrees 10X each 3 seconds (chin in neutral) ? ? ?08/05/21 ?Therapeutic Exercise: ? Aerobic: ?Supine: ?Prone: ? Seated:Lat pull machine 35# X20 ? Standing: Bilat shoulder Rows,extensions, Horizontal abd,  and bilat ER all with with green X20 ea. Face pulls at cable machine with rope attachment 2X10  ?Neuromuscular Re-education: ?Manual Therapy: ?Therapeutic Activity: ?Self Care: ?Trigger Point Dry Needling:  ?Modalities: Cervical traction static 15 minutes 27# ? ? ?07/24/21 ?Cervical traction static 8 minutes 25# ?  ?Therapeutic Activities: Review imaging, spine anatomy, exam findings, suggestions to modify posture with the phone and computer and review home walking and exercises ?  ?Therapeutic exercises: ?Shoulder blade pinches 5X 5 seconds ?  ?Cervical Extension Isometrics 10X 5 seconds ?  ?  ?  ?PATIENT EDUCATION:  ?Education details: See above ?Person educated: Patient ?Education method: Explanation, Demonstration, Tactile cues, Verbal cues, and Handouts ?Education comprehension: verbalized understanding, returned demonstration, verbal cues required, tactile cues required, and needs further education ?  ?  ?HOME EXERCISE PROGRAM: ?  ?Access Code: [email or web address] ?URL: [email or web address] ?Date: 08/08/2021 ?Prepared by: Pauletta Browns ? ?Exercises ?- Standing Scapular Retraction  - 5 x daily - 7 x weekly - 1 sets - 5 reps - 5 second hold ?- Standing Isometric Cervical Extension with Manual Resistance  - 5 x daily - 7 x weekly - 1 sets - 5 reps - 5 hold ?- Scapular Retraction with Resistance  - 1 x daily - 2-3 x weekly - 1 sets - 20 reps - 3 seconds hold ?- Shoulder External Rotation with Anchored Resistance with Towel Under Elbow  - 1 x daily - 2-3 x weekly - 1-2 sets - 10 reps - 3 hold ?- Prone Shoulder Horizontal Abduction with Thumbs Up  - 1 x daily - 2-3 x weekly - 1-2 sets - 10 reps - 3 seconds hold ? ? ?ASSESSMENT: ?  ?CLINICAL IMPRESSION: ?Cameron Schmitt is making rapid progress towards long-term goals.  He has not had radicular symptoms and he has been easing back into previously aggravating activities with good early results.  Consider reassessment next visit if current progress continues. ?  ?  ?OBJECTIVE  IMPAIRMENTS decreased activity tolerance, decreased endurance, decreased knowledge of condition, decreased safety awareness, increased edema, impaired perceived functional ability, improper body mechanics, and postural dysfunction.  ?  ?ACTIVITY LIMITATIONS community activity and occupation.  ?  ?PERSONAL FACTORS  The fact that this is the second exacerbation of this problem is also affecting patient's functional outcome.  ?  ?  ?REHAB POTENTIAL: Good ?  ?CLINICAL DECISION MAKING: Stable/uncomplicated ?  ?EVALUATION COMPLEXITY: Low ?  ?  ?GOALS: ?Goals reviewed with patient? Yes ?  ?SHORT TERM GOALS: ?  ?Cameron Schmitt will be able to demonstrate correct posture with sitting, standing and select functional activities (checking his phone). ?Baseline: Started today ?Target date: 08/21/2021 ?Goal status: INITIAL ?  ?2.  Cameron Schmitt will be independent with his day 1 HEP. ?Baseline: Started today ?Target date: 08/21/2021 ?Goal status: INITIAL ?  ?  ?LONG TERM GOALS: ?  ?Improve FOTO to 75 in 9 visits. ?Baseline: 61 ?Target date: 09/18/2021 ?Goal status: INITIAL ?  ?2.  Cameron Schmitt will report minimal to no R UE tingling (0-0.5/10 VAS) at DC. ?Baseline: Was 5/10, now 3/10 ?Target date: 09/18/2021 ?Goal status: INITIAL ?  ?3.  Cameron Schmitt will be independent with his long-term HEP at DC. ?Baseline: Started today ?Target date: 09/18/2021 ?Goal status: INITIAL ?  ?PLAN: ?PT FREQUENCY: 1-2x/week ?  ?PT DURATION: 8 weeks ?  ?PLANNED INTERVENTIONS: Therapeutic exercises, Therapeutic activity, Neuromuscular re-education, Patient/Family education, Cryotherapy, Traction, and Manual therapy ?  ?PLAN FOR NEXT SESSION: Review HEP with additional postural, scapular strengthening and PRACTICAL posture and body mechanics work.  Reassessment. ? ? ? ?Cherlyn Cushing, PT, MPT ?08/08/2021, 3:13 PM ? ?  ? ?

## 2021-08-14 ENCOUNTER — Encounter: Payer: Self-pay | Admitting: Rehabilitative and Restorative Service Providers"

## 2021-08-14 ENCOUNTER — Ambulatory Visit (INDEPENDENT_AMBULATORY_CARE_PROVIDER_SITE_OTHER): Payer: BC Managed Care – PPO | Admitting: Rehabilitative and Restorative Service Providers"

## 2021-08-14 DIAGNOSIS — R600 Localized edema: Secondary | ICD-10-CM

## 2021-08-14 DIAGNOSIS — M5412 Radiculopathy, cervical region: Secondary | ICD-10-CM | POA: Diagnosis not present

## 2021-08-14 DIAGNOSIS — R293 Abnormal posture: Secondary | ICD-10-CM

## 2021-08-14 NOTE — Therapy (Addendum)
?OUTPATIENT PHYSICAL THERAPY TREATMENT NOTE ? ? ?Patient Name: Cameron Schmitt ?MRN: [id number] ?DOB:02/14/1961, 61 y.o., male ?Today's Date: 08/14/2021 ? ?PCP: Denita Lung, MD ?REFERRING PROVIDER: Lorine Bears, NP ? ?PHYSICAL THERAPY DISCHARGE SUMMARY ? ?Visits from Start of Care: 4 ? ?Current functional level related to goals / functional outcomes: ?See note ?  ?Remaining deficits: ?None ?  ?Education / Equipment: ?Updated HEP  ? ?Patient agrees to discharge. Patient goals were met. Patient is being discharged due to meeting the stated rehab goals. ?  ? PT End of Session - 08/14/21 1516   ? ? Visit Number 4   ? Number of Visits 12   ? Date for PT Re-Evaluation 09/18/21   ? PT Start Time 1515   ? PT Stop Time 2876   ? PT Time Calculation (min) 39 min   ? Activity Tolerance Patient tolerated treatment well;No increased pain   ? Behavior During Therapy [facility] for tasks assessed/performed   ? ?  ?  ? ?  ? ? ? ? ?Past Medical History:  ?Diagnosis Date  ? Hypertension   ? Inguinal cyst   ? Leukopenia   ? ?History reviewed. No pertinent surgical history. ?Patient Active Problem List  ? Diagnosis Date Noted  ? Left inguinal hernia 10/19/2019  ? History of shingles 10/19/2019  ? Neutropenia ([place]) 09/03/2011  ? Essential hypertension 09/03/2011  ?PCP: Denita Lung, MD ?  ?REFERRING PROVIDER: Lorine Bears, NP ?  ?REFERRING DIAG: M54.12 (ICD-10-CM) - Radiculopathy, cervical region  ?  ?THERAPY DIAG:  ?Abnormal posture ?  ?Radiculopathy, cervical region ?  ?Localized edema ?  ?ONSET DATE: Years in duration.  Most recent episode 3-4 months.   ?  ?SUBJECTIVE:                                                                                                                                                                                                        ?  ?SUBJECTIVE STATEMENT: ?[name] notes he has not had any R sided radicular symptoms in over a week. ?  ?PAIN:  ?Are you having pain? No ?NPRS scale: 0/10 this  week ?Pain location: R hand to digits 4 and 5. ?Pain orientation: Right  ?PAIN TYPE: tingling ?Pain description: tingling and numb   ?Aggravating factors: Looking up and to the R ?Relieving factors: Change of position ?  ?PRECAUTIONS: none ?  ?WEIGHT BEARING RESTRICTIONS No ?  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 ?  ?PATIENT GOALS Get rid of all tingling  and get back to 100%. ?  ?OBJECTIVE:  ?  ?DIAGNOSTIC FINDINGS:  ?IMPRESSION: ?1. Multilevel cervical spondylosis with resultant mild diffuse ?spinal stenosis at C3-4 through C6-7. Specific note made of a right ?paracentral disc protrusion at C4-5, which could potentially ?contribute to right-sided symptoms. ?2. Multifactorial degenerative changes with resultant multilevel ?foraminal narrowing as above. Notable findings include moderate left ?C4 foraminal stenosis, moderate bilateral C5 and C6 foraminal ?narrowing, with severe right worse than left C7 foraminal stenosis ?  ?PATIENT SURVEYS:  ?FOTO (08/14/2021) 96 (was 61, Goal 75) ?FOTO 61 (Goal 75 in 9 visits) ? ? ?           ?POSTURE:  ?Forward head, internally rotated and protracted shoulders. ?  ?CERVICAL AROM/PROM ?  ?A/PROM AROM (deg) ?07/24/2021 AROM (deg) 08/14/2021  ?Flexion     ?Extension 55   ?Right lateral flexion 35   ?Left lateral flexion 35   ?Right rotation 60   ?Left rotation 55   ? (Blank rows = not tested) ?  ?Strength with hand-held dynamometer in pounds: ?  ?Strength in pounds Right ?07/24/2021 Left ?07/24/2021 L/R in pounds 08/14/2021  ?        ?Cervical extension 40.9 pounds   40.7 pounds  ?Cervical lateral bending 35.2 pounds 26.7 pounds 35.0/39.8 pounds  ?  ?CERVICAL SPECIAL TESTS:  ?Spurling's test: Positive at evaluation, negative at 08/14/2021 reassessment ?  ?  ?PATIENT SURVEYS:  ?FOTO 61 (Goal 75 in 9 visits, now 42) ?  ?TODAY'S TREATMENT:  ?08/14/2021 ? ?Cervical traction static 8 minutes 25# ?  ?Therapeutic Activities: Review imaging, spine  anatomy, suggestions to modify posture with the phone, computer, work and workout activities and review home walking and exercises ?  ?Therapeutic exercises: ?Shoulder blade pinches 10X 5 seconds ?  ?Pull to chest Blue theraband 20X ? ?Shoulder ER Theraband Green 10X B ? ?Cervical Extension Isometrics 10X 5 seconds ? ?Prone 90 & 100 degrees 10X each 3 seconds (chin in neutral) ? ? ?08/08/21 ?Cervical traction static 8 minutes 27# ?  ?Therapeutic Activities: Review imaging, spine anatomy, suggestions to modify posture with the phone, computer, work and workout activities and review home walking and exercises ?  ?Therapeutic exercises: ?Shoulder blade pinches 5X 5 seconds ?  ?Pull to chest Blue theraband 20X ? ?Shoulder ER Theraband Green 10X B ? ?Cervical Extension Isometrics 10X 5 seconds ? ?Prone 90 & 100 degrees 10X each 3 seconds (chin in neutral) ? ? ?08/05/21 ?Therapeutic Exercise: ? Aerobic: ?Supine: ?Prone: ? Seated:Lat pull machine 35# X20 ? Standing: Bilat shoulder Rows,extensions, Horizontal abd, and bilat ER all with with green X20 ea. Face pulls at cable machine with rope attachment 2X10  ?Neuromuscular Re-education: ?Manual Therapy: ?Therapeutic Activity: ?Self Care: ?Trigger Point Dry Needling:  ?Modalities: Cervical traction static 15 minutes 27# ? ? ?  ?PATIENT EDUCATION:  ?Education details: See above ?Person educated: Patient ?Education method: Explanation, Demonstration, Tactile cues, Verbal cues, and Handouts ?Education comprehension: verbalized understanding, returned demonstration, verbal cues required, tactile cues required, and needs further education ?  ?  ?HOME EXERCISE PROGRAM: ?  ?Access Code: [email or web address] ?URL: [email or web address] ?Date: 08/08/2021 ?Prepared by: Vista Mink ? ?Exercises ?- Standing Scapular Retraction  - 5 x daily - 7 x weekly - 1 sets - 5 reps - 5 second hold ?- Standing Isometric Cervical Extension with Manual Resistance  - 5 x daily - 7 x weekly - 1 sets -  5 reps - 5 hold ?- Scapular Retraction with Resistance  - 1  x daily - 2-3 x weekly - 1 sets - 20 reps - 3 seconds hold ?- Shoulder External Rotation with Anchored Resistance with Towel Under Elbow  - 1 x daily - 2-3 x weekly - 1-2 sets - 10 reps - 3 hold ?- Prone Shoulder Horizontal Abduction with Thumbs Up  - 1 x daily - 2-3 x weekly - 1-2 sets - 10 reps - 3 seconds hold ? ? ?ASSESSMENT: ?  ?CLINICAL IMPRESSION: ?[name] made rapid progress towards long-term goals.  He has not had radicular symptoms for > a week and he has been performing previously aggravating activities with good early results.  Kory appears ready for transfer into independent rehabilitation. ?  ?  ?OBJECTIVE IMPAIRMENTS decreased activity tolerance, decreased endurance, decreased knowledge of condition, decreased safety awareness, increased edema, impaired perceived functional ability, improper body mechanics, and postural dysfunction.  ?  ?ACTIVITY LIMITATIONS community activity and occupation.  ?  ?PERSONAL FACTORS  The fact that this is the second exacerbation of this problem is also affecting patient's functional outcome.  ?  ?  ?REHAB POTENTIAL: Good ?  ?CLINICAL DECISION MAKING: Stable/uncomplicated ?  ?EVALUATION COMPLEXITY: Low ?  ?  ?GOALS: ?Goals reviewed with patient? Yes ?  ?SHORT TERM GOALS: ?  ?[name] will be able to demonstrate correct posture with sitting, standing and select functional activities (checking his phone). ?Baseline: Started today ?Target date: 08/21/2021 ?Goal status: Met 08/14/2021 ?  ?2.  [name] will be independent with his day 1 HEP. ?Baseline: Started today ?Target date: 08/21/2021 ?Goal status: Met 08/14/2021 ?  ?  ?LONG TERM GOALS: ?  ?Improve FOTO to 75 in 9 visits. ?Baseline: 61 ?Target date: 09/18/2021 ?Goal status: Met (96 on 08/14/2021) ?  ?2.  [name] will report minimal to no R UE tingling (0-0.5/10 VAS) at DC. ?Baseline: Was 5/10, now 3/10 ?Target date: 09/18/2021 ?Goal status: Met 08/14/2021 ?  ?3.  [name] will be  independent with his long-term HEP at DC. ?Baseline: Started today ?Target date: 09/18/2021 ?Goal status: Met 08/14/2021 ?  ?PLAN: ?PT FREQUENCY: 1-2x/week ?  ?PT DURATION: 8 weeks ?  ?PLANNED INTERVENTIONS: Therapeu

## 2021-08-16 ENCOUNTER — Encounter: Payer: BC Managed Care – PPO | Admitting: Rehabilitative and Restorative Service Providers"

## 2021-08-16 NOTE — Progress Notes (Signed)
Surgical Instructions ? ? ? Your procedure is scheduled on 08/22/21. ? Report to [facility] Main Entrance "A" at 6:30 A.M., then check in with the Admitting office. ? Call this number if you have problems the morning of surgery: ? [phone] ? ? If you have any questions prior to your surgery date call [phone]: Open Monday-Friday 8am-4pm ? ? ? Remember: ? Do not eat after midnight the night before your surgery ? ?You may drink clear liquids until 5:30am the morning of your surgery.   ?Clear liquids allowed are: Water, Non-Citrus Juices (without pulp), Carbonated Beverages, Clear Tea, Black Coffee ONLY (NO MILK, CREAM OR POWDERED CREAMER of any kind), and Gatorade ?  ? Take these medicines the morning of surgery with A SIP OF WATER:  ?amLODipine (NORVASC) ? ?As of today, STOP taking any Aspirin (unless otherwise instructed by your surgeon) Aleve, Naproxen, Ibuprofen, Motrin, Advil, Goody's, BC's, all herbal medications, fish oil, and all vitamins. ? ?         ?Do not wear jewelry or makeup ?Do not wear lotions, powders, perfumes/colognes, or deodorant. ?Men may shave face and neck. ?Do not bring valuables to the hospital. ?Do not wear nail polish, gel polish, artificial nails, or any other type of covering on natural nails (fingers and toes) ?If you have artificial nails or gel coating that need to be removed by a nail salon, please have this removed prior to surgery. Artificial nails or gel coating may interfere with anesthesia's ability to adequately monitor your vital signs. ? ?[place] is not responsible for any belongings or valuables. .  ? ?Do NOT Smoke (Tobacco/Vaping)  24 hours prior to your procedure ? ?If you use a CPAP at night, you may bring your mask for your overnight stay. ?  ?Contacts, glasses, hearing aids, dentures or partials may not be worn into surgery, please bring cases for these belongings ?  ?For patients admitted to the hospital, discharge time will be determined by your treatment  team. ?  ?Patients discharged the day of surgery will not be allowed to drive home, and someone needs to stay with them for 24 hours. ? ? ?SURGICAL WAITING ROOM VISITATION ?Patients having surgery or a procedure in a hospital may have two support people. ?Children under the age of 36 must have an adult with them who is not the patient. ?They may stay in the waiting area during the procedure and may switch out with other visitors. If the patient needs to stay at the hospital during part of their recovery, the visitor guidelines for inpatient rooms apply. ? ?Please refer to the [place] website for the visitor guidelines for Inpatients (after your surgery is over and you are in a regular room).  ? ? ? ? ? ?Special instructions:   ? ?Oral Hygiene is also important to reduce your risk of infection.  Remember - BRUSH YOUR TEETH THE MORNING OF SURGERY WITH YOUR REGULAR TOOTHPASTE ? ? ?Cameron Schmitt- Preparing For Surgery ? ?Before surgery, you can play an important role. Because skin is not sterile, your skin needs to be as free of germs as possible. You can reduce the number of germs on your skin by washing with CHG (chlorahexidine gluconate) Soap before surgery.  CHG is an antiseptic cleaner which kills germs and bonds with the skin to continue killing germs even after washing.   ? ? ?Please do not use if you have an allergy to CHG or antibacterial soaps. If your skin becomes reddened/irritated stop  using the CHG.  ?Do not shave (including legs and underarms) for at least 48 hours prior to first CHG shower. It is OK to shave your face. ? ?Please follow these instructions carefully. ?  ? ? Shower the NIGHT BEFORE SURGERY and the MORNING OF SURGERY with CHG Soap.  ? If you chose to wash your hair, wash your hair first as usual with your normal shampoo. After you shampoo, rinse your hair and body thoroughly to remove the shampoo.  Then [employer] and genitals (private parts) with your normal soap and rinse thoroughly to  remove soap. ? ?After that Use CHG Soap as you would any other liquid soap. You can apply CHG directly to the skin and wash gently with a scrungie or a clean washcloth.  ? ?Apply the CHG Soap to your body ONLY FROM THE NECK DOWN.  Do not use on open wounds or open sores. Avoid contact with your eyes, ears, mouth and genitals (private parts). Wash Face and genitals (private parts)  with your normal soap.  ? ?Wash thoroughly, paying special attention to the area where your surgery will be performed. ? ?Thoroughly rinse your body with warm water from the neck down. ? ?DO NOT shower/wash with your normal soap after using and rinsing off the CHG Soap. ? ?Pat yourself dry with a CLEAN TOWEL. ? ?Wear CLEAN PAJAMAS to bed the night before surgery ? ?Place CLEAN SHEETS on your bed the night before your surgery ? ?DO NOT SLEEP WITH PETS. ? ? ?Day of Surgery: ?Take a shower with CHG soap. ?Wear Clean/Comfortable clothing the morning of surgery ?Do not apply any deodorants/lotions.   ?Remember to brush your teeth WITH YOUR REGULAR TOOTHPASTE. ? ? ? ?If you received a COVID test during your pre-op visit  it is requested that you wear a mask when out in public, stay away from anyone that may not be feeling well and notify your surgeon if you develop symptoms. If you have been in contact with anyone that has tested positive in the last 10 days please notify you surgeon. ? ?  ?Please read over the following fact sheets that you were given.  ? ?

## 2021-08-17 DIAGNOSIS — R7309 Other abnormal glucose: Secondary | ICD-10-CM | POA: Diagnosis not present

## 2021-08-19 ENCOUNTER — Other Ambulatory Visit: Payer: Self-pay

## 2021-08-19 ENCOUNTER — Encounter (HOSPITAL_COMMUNITY)
Admission: RE | Admit: 2021-08-19 | Discharge: 2021-08-19 | Disposition: A | Discharge status: Home / Self Care | Payer: BC Managed Care – PPO | Source: Ambulatory Visit | Attending: General Surgery | Admitting: General Surgery

## 2021-08-19 ENCOUNTER — Encounter (HOSPITAL_COMMUNITY): Payer: Self-pay

## 2021-08-19 VITALS — BP 144/90 | HR 82 | Temp 98.0°F | Resp 17 | Ht 68.0 in | Wt 169.6 lb

## 2021-08-19 DIAGNOSIS — Z01818 Encounter for other preprocedural examination: Secondary | ICD-10-CM | POA: Insufficient documentation

## 2021-08-19 DIAGNOSIS — I2510 Atherosclerotic heart disease of native coronary artery without angina pectoris: Secondary | ICD-10-CM | POA: Insufficient documentation

## 2021-08-19 DIAGNOSIS — I10 Essential (primary) hypertension: Secondary | ICD-10-CM | POA: Insufficient documentation

## 2021-08-19 DIAGNOSIS — K4090 Unilateral inguinal hernia, without obstruction or gangrene, not specified as recurrent: Secondary | ICD-10-CM | POA: Diagnosis not present

## 2021-08-19 DIAGNOSIS — M4802 Spinal stenosis, cervical region: Secondary | ICD-10-CM | POA: Insufficient documentation

## 2021-08-19 DIAGNOSIS — Z7982 Long term (current) use of aspirin: Secondary | ICD-10-CM | POA: Diagnosis not present

## 2021-08-19 DIAGNOSIS — R9431 Abnormal electrocardiogram [ECG] [EKG]: Secondary | ICD-10-CM | POA: Insufficient documentation

## 2021-08-19 DIAGNOSIS — Z79899 Other long term (current) drug therapy: Secondary | ICD-10-CM | POA: Diagnosis not present

## 2021-08-19 DIAGNOSIS — Z86718 Personal history of other venous thrombosis and embolism: Secondary | ICD-10-CM | POA: Diagnosis not present

## 2021-08-19 DIAGNOSIS — Z86711 Personal history of pulmonary embolism: Secondary | ICD-10-CM | POA: Diagnosis not present

## 2021-08-19 DIAGNOSIS — M47812 Spondylosis without myelopathy or radiculopathy, cervical region: Secondary | ICD-10-CM | POA: Insufficient documentation

## 2021-08-19 DIAGNOSIS — M50221 Other cervical disc displacement at C4-C5 level: Secondary | ICD-10-CM | POA: Insufficient documentation

## 2021-08-19 HISTORY — DX: Other pulmonary embolism without acute cor pulmonale: I26.99

## 2021-08-19 LAB — CBC
HCT: 45.2 % (ref 39.0–52.0)
Hemoglobin: 14 g/dL (ref 13.0–17.0)
MCH: 25.7 pg — ABNORMAL LOW (ref 26.0–34.0)
MCHC: 31 g/dL (ref 30.0–36.0)
MCV: 83.1 fL (ref 80.0–100.0)
Platelets: 227 10*3/uL (ref 150–400)
RBC: 5.44 MIL/uL (ref 4.22–5.81)
RDW: 13.9 % (ref 11.5–15.5)
WBC: 2.9 10*3/uL — ABNORMAL LOW (ref 4.0–10.5)
nRBC: 0 % (ref 0.0–0.2)

## 2021-08-19 LAB — BASIC METABOLIC PANEL
Anion gap: 7 (ref 5–15)
BUN: 13 mg/dL (ref 8–23)
CO2: 30 mmol/L (ref 22–32)
Calcium: 9.4 mg/dL (ref 8.9–10.3)
Chloride: 100 mmol/L (ref 98–111)
Creatinine, Ser: 1.1 mg/dL (ref 0.61–1.24)
GFR, Estimated: >60 mL/min (ref >60)
Glucose, Bld: 94 mg/dL (ref 70–99)
Potassium: 3 mmol/L — ABNORMAL LOW (ref 3.5–5.1)
Sodium: 137 mmol/L (ref 135–145)

## 2021-08-19 NOTE — Progress Notes (Addendum)
PCP - Dr. Sharlot Gowda ?Cardiologist - denies ? ?PPM/ICD - n/a ? ?Chest x-ray - n/a ?EKG - 08/19/21 ?Stress Test - denies ?ECHO - denies ?Cardiac Cath - denies ? ?Sleep Study - denies ?CPAP - denies ? ?Blood Thinner Instructions: n/a ?Aspirin Instructions: LD 08/19/21. No instructions given per pt. Pt told to hold from now until surgery.  ? ?ERAS Protcol -Clear liquids until 0530 DOS ?PRE-SURGERY Ensure or G2- none ordered.  ? ?COVID TEST- n/a ? ?Anesthesia review: Yes, EKG review ? ?Patient denies shortness of breath, fever, cough and chest pain at PAT appointment ? ? ?All instructions explained to the patient, with a verbal understanding of the material. Patient agrees to go over the instructions while at home for a better understanding. Patient also instructed to self quarantine after being tested for COVID-19. The opportunity to ask questions was provided. ? ? ?

## 2021-08-20 ENCOUNTER — Encounter (HOSPITAL_COMMUNITY): Payer: Self-pay

## 2021-08-20 NOTE — Anesthesia Preprocedure Evaluation (Addendum)
Anesthesia Evaluation  ?Patient identified by MRN, date of birth, ID band ?Patient awake ? ? ? ?Reviewed: ?Allergy & Precautions, NPO status , Patient's Chart, lab work & pertinent test results ? ?History of Anesthesia Complications ?Negative for: history of anesthetic complications ? ?Airway ?Mallampati: I ? ?TM Distance: >3 FB ?Neck ROM: Full ? ? ? Dental ? ?(+) Chipped, Dental Advisory Given, Missing ?  ?Pulmonary ?neg pulmonary ROS,  ?  ?breath sounds clear to auscultation ? ? ? ? ? ? Cardiovascular ?hypertension, Pt. on medications ?(-) angina ?Rhythm:Regular Rate:Normal ? ? ?  ?Neuro/Psych ?negative neurological ROS ?   ? GI/Hepatic ?negative GI ROS, Neg liver ROS,   ?Endo/Other  ? ? Renal/GU ?negative Renal ROS  ? ?  ?Musculoskeletal ? ? Abdominal ?  ?Peds ? Hematology ?negative hematology ROS ?(+)   ?Anesthesia Other Findings ? ? Reproductive/Obstetrics ? ?  ? ? ? ? ? ? ? ? ? ? ? ? ? ?  ?  ? ? ? ? ? ? ?Anesthesia Physical ?Anesthesia Plan ? ?ASA: 2 ? ?Anesthesia Plan: General  ? ?Post-op Pain Management: Tylenol PO (pre-op)*  ? ?Induction: Intravenous ? ?PONV Risk Score and Plan: 2 and Ondansetron and Dexamethasone ? ?Airway Management Planned: Oral ETT ? ?Additional Equipment: None ? ?Intra-op Plan:  ? ?Post-operative Plan: Extubation in OR ? ?Informed Consent: I have reviewed the patients History and Physical, chart, labs and discussed the procedure including the risks, benefits and alternatives for the proposed anesthesia with the patient or authorized representative who has indicated his/her understanding and acceptance.  ? ? ? ?Dental advisory given ? ?Plan Discussed with: CRNA and Surgeon ? ?Anesthesia Plan Comments: (PAT note written 08/20/2021 by Myra Gianotti, PA-C. ? ?February 2023 cervical spine MRI showed multilevel cervical spondylosis with mild diffuse spinal stenosis at C3-4 through C6-7 with right paracentral disc protrusion at C4-5. ?)  ? ? ? ? ?Anesthesia  Quick Evaluation ? ?

## 2021-08-20 NOTE — Progress Notes (Signed)
Anesthesia Chart Review: ? Case: [id number] Date/Time: 08/22/21 0815  ? Procedure: LEFT INGUINAL HERNIA REPAIR WITH MESH (Left)  ? Anesthesia type: General  ? Pre-op diagnosis: LEFT INGUINAL HERNIA  ? Location: [facility] ROOM 12 / [facility]  ? Surgeons: Jovita Kussmaul, MD  ? ?  ? ? ?DISCUSSION: Patient is a 61 year old male scheduled for the above procedure.  He was referred by his PCP Denita Lung, MD. ? ?History includes HTN, PE (09/11/03), leukopenia (reported prior to 2014), never smoker. ? ?08/19/21 labs appear stable. WBC 2.9 with range of 2.4-3.4 since 10/15/12 in [facility]. H/H and platelet count normal. K 3.0. Cr 1.10.  ? ?February 2023 cervical spine MRI showed multilevel cervical spondylosis with mild diffuse spinal stenosis at C3-4 through C6-7 with right paracentral disc protrusion at C4-5.  Dr. Redmond School referred him to Dr. Ernestina Patches. ? ?Last aspirin dose documented as 08/19/2021.  Anesthesia team to evaluate on the day of surgery. ? ? ?VS: BP (!) 144/90   Pulse 82   Temp 36.7 ?C (Oral)   Resp 17   Ht _0  (1.727 m)   Wt 76.9 kg   SpO2 100%   BMI 25.79 kg/m?  ? ? ?PROVIDERS: ?Denita Lung, MD is PCP ? ? ?LABS: Labs reviewed: Acceptable for surgery. ?(all labs ordered are listed, but only abnormal results are displayed) ? ?Labs Reviewed  ?BASIC METABOLIC PANEL - Abnormal; Notable for the following components:  ?    Result Value  ? Potassium 3.0 (*)   ? All other components within normal limits  ?CBC - Abnormal; Notable for the following components:  ? WBC 2.9 (*)   ? MCH 25.7 (*)   ? All other components within normal limits  ? ? ?IMAGES: ?MRI C-spine 07/01/21: ?IMPRESSION: ?1. Multilevel cervical spondylosis with resultant mild diffuse ?spinal stenosis at C3-4 through C6-7. Specific note made of a right ?paracentral disc protrusion at C4-5, which could potentially ?contribute to right-sided symptoms. ?2. Multifactorial degenerative changes with resultant multilevel ?foraminal narrowing as above. Notable findings  include moderate left ?C4 foraminal stenosis, moderate bilateral C5 and C6 foraminal ?narrowing, with severe right worse than left C7 foraminal stenosis. ?  ?  ? ?EKG: 08/19/21: ?Normal sinus rhythm ?Nonspecific T wave abnormality ?Abnormal ECG ?When compared with ECG of 12-Sep-2003 04:42, ?PREVIOUS ECG IS PRESENT LVH criteria not met ?Confirmed by Rudean Haskell [phone]) on 08/19/2021 4:26:05 PM ? ? ?CV: N/A ? ?Past Medical History:  ?Diagnosis Date  ? Hypertension   ? Inguinal cyst   ? Leukopenia   ? Pulmonary embolism ([place]) 09/11/2003  ? ? ?Past Surgical History:  ?Procedure Laterality Date  ? COLONOSCOPY    ? NO PAST SURGERIES    ? ? ?MEDICATIONS: ? amLODipine (NORVASC) 5 MG tablet  ? aspirin 81 MG tablet  ? lisinopril-hydrochlorothiazide (ZESTORETIC) 20-12.5 MG tablet  ? Multiple Vitamin (MULTIVITAMIN WITH MINERALS) TABS tablet  ? OVER THE COUNTER MEDICATION  ? ?No current facility-administered medications for this encounter.  ?OTC medication is beet root supplement.  Instructed to hold herbal medications, NSAIDs, vitamins, fish oil at his PAT RN visit. ? ? ?Myra Gianotti, PA-C ?Surgical Short Stay/Anesthesiology ?[facility] Phone [phone] ?[facility] Phone [phone] ?08/20/2021 11:25 AM ? ? ? ? ? ? ? ?

## 2021-08-21 ENCOUNTER — Encounter: Payer: BC Managed Care – PPO | Admitting: Rehabilitative and Restorative Service Providers"

## 2021-08-22 ENCOUNTER — Ambulatory Visit (HOSPITAL_COMMUNITY): Payer: BC Managed Care – PPO | Admitting: Anesthesiology

## 2021-08-22 ENCOUNTER — Other Ambulatory Visit: Payer: Self-pay

## 2021-08-22 ENCOUNTER — Encounter (HOSPITAL_COMMUNITY): Payer: Self-pay | Admitting: General Surgery

## 2021-08-22 ENCOUNTER — Encounter (HOSPITAL_COMMUNITY)
Admission: RE | Disposition: A | Discharge status: Home / Self Care | Payer: Self-pay | Source: Home / Self Care | Attending: General Surgery

## 2021-08-22 ENCOUNTER — Ambulatory Visit (HOSPITAL_COMMUNITY): Payer: BC Managed Care – PPO | Admitting: Vascular Surgery

## 2021-08-22 ENCOUNTER — Ambulatory Visit (HOSPITAL_COMMUNITY)
Admission: RE | Admit: 2021-08-22 | Discharge: 2021-08-22 | Disposition: A | Discharge status: Home / Self Care | Payer: BC Managed Care – PPO | Attending: General Surgery | Admitting: General Surgery

## 2021-08-22 DIAGNOSIS — Z79899 Other long term (current) drug therapy: Secondary | ICD-10-CM | POA: Insufficient documentation

## 2021-08-22 DIAGNOSIS — Z86711 Personal history of pulmonary embolism: Secondary | ICD-10-CM | POA: Diagnosis not present

## 2021-08-22 DIAGNOSIS — K4090 Unilateral inguinal hernia, without obstruction or gangrene, not specified as recurrent: Secondary | ICD-10-CM | POA: Diagnosis not present

## 2021-08-22 DIAGNOSIS — K4190 Unilateral femoral hernia, without obstruction or gangrene, not specified as recurrent: Secondary | ICD-10-CM | POA: Diagnosis not present

## 2021-08-22 DIAGNOSIS — I10 Essential (primary) hypertension: Secondary | ICD-10-CM | POA: Diagnosis not present

## 2021-08-22 DIAGNOSIS — Z86718 Personal history of other venous thrombosis and embolism: Secondary | ICD-10-CM | POA: Diagnosis not present

## 2021-08-22 DIAGNOSIS — Z7982 Long term (current) use of aspirin: Secondary | ICD-10-CM | POA: Diagnosis not present

## 2021-08-22 HISTORY — PX: INGUINAL HERNIA REPAIR: SHX194

## 2021-08-22 SURGERY — REPAIR, HERNIA, INGUINAL, ADULT
Anesthesia: General | Laterality: Left

## 2021-08-22 MED ORDER — CEFAZOLIN SODIUM-DEXTROSE 2-4 GM/100ML-% IV SOLN
2.0000 g | INTRAVENOUS | Status: AC
  Administered 2021-08-22: 2 g via INTRAVENOUS
  Filled 2021-08-22: qty 100

## 2021-08-22 MED ORDER — MIDAZOLAM HCL 2 MG/2ML IJ SOLN: 0.5000 mg | Freq: Once | INTRAMUSCULAR | Status: DC | PRN

## 2021-08-22 MED ORDER — OXYCODONE HCL 5 MG/5ML PO SOLN: 5.0000 mg | Freq: Once | ORAL | Status: DC | PRN

## 2021-08-22 MED ORDER — LACTATED RINGERS IV SOLN
INTRAVENOUS | Status: DC
Start: 2021-08-22 — End: 2021-08-22

## 2021-08-22 MED ORDER — 0.9 % SODIUM CHLORIDE (POUR BTL) OPTIME
TOPICAL | Status: DC | PRN
  Administered 2021-08-22: 1000 mL

## 2021-08-22 MED ORDER — OXYCODONE HCL 5 MG PO TABS: 5.0000 mg | ORAL_TABLET | Freq: Once | ORAL | Status: DC | PRN

## 2021-08-22 MED ORDER — ORAL CARE MOUTH RINSE: 15.0000 mL | Freq: Once | OROMUCOSAL | Status: AC

## 2021-08-22 MED ORDER — OXYCODONE HCL 5 MG PO TABS: 5.0000 mg | ORAL_TABLET | Freq: Four times a day (QID) | ORAL | 0 refills | Status: DC | PRN

## 2021-08-22 MED ORDER — CHLORHEXIDINE GLUCONATE CLOTH 2 % EX PADS: 6.0000 | MEDICATED_PAD | Freq: Once | CUTANEOUS | Status: DC

## 2021-08-22 MED ORDER — BUPIVACAINE-EPINEPHRINE (PF) 0.25% -1:200000 IJ SOLN
INTRAMUSCULAR | Status: AC
  Filled 2021-08-22: qty 30

## 2021-08-22 MED ORDER — CHLORHEXIDINE GLUCONATE 0.12 % MT SOLN
15.0000 mL | Freq: Once | OROMUCOSAL | Status: AC
  Administered 2021-08-22: 15 mL via OROMUCOSAL
  Filled 2021-08-22: qty 15

## 2021-08-22 MED ORDER — MIDAZOLAM HCL 2 MG/2ML IJ SOLN
INTRAMUSCULAR | Status: DC | PRN
  Administered 2021-08-22: 2 mg via INTRAVENOUS

## 2021-08-22 MED ORDER — DEXAMETHASONE SODIUM PHOSPHATE 10 MG/ML IJ SOLN
INTRAMUSCULAR | Status: DC | PRN
  Administered 2021-08-22: 10 mg via INTRAVENOUS

## 2021-08-22 MED ORDER — MEPERIDINE HCL 25 MG/ML IJ SOLN: 6.2500 mg | INTRAMUSCULAR | Status: DC | PRN

## 2021-08-22 MED ORDER — PROPOFOL 10 MG/ML IV BOLUS
INTRAVENOUS | Status: AC
  Filled 2021-08-22: qty 20

## 2021-08-22 MED ORDER — ROCURONIUM BROMIDE 10 MG/ML (PF) SYRINGE
PREFILLED_SYRINGE | INTRAVENOUS | Status: DC | PRN
  Administered 2021-08-22: 60 mg via INTRAVENOUS

## 2021-08-22 MED ORDER — HYDROMORPHONE HCL 1 MG/ML IJ SOLN: 0.2500 mg | INTRAMUSCULAR | Status: DC | PRN

## 2021-08-22 MED ORDER — GABAPENTIN 300 MG PO CAPS
300.0000 mg | ORAL_CAPSULE | ORAL | Status: AC
  Administered 2021-08-22: 300 mg via ORAL
  Filled 2021-08-22: qty 1

## 2021-08-22 MED ORDER — FENTANYL CITRATE (PF) 250 MCG/5ML IJ SOLN
INTRAMUSCULAR | Status: AC
  Filled 2021-08-22: qty 5

## 2021-08-22 MED ORDER — PROPOFOL 10 MG/ML IV BOLUS
INTRAVENOUS | Status: DC | PRN
  Administered 2021-08-22: 120 mg via INTRAVENOUS

## 2021-08-22 MED ORDER — ONDANSETRON HCL 4 MG/2ML IJ SOLN
INTRAMUSCULAR | Status: DC | PRN
  Administered 2021-08-22: 4 mg via INTRAVENOUS

## 2021-08-22 MED ORDER — BUPIVACAINE-EPINEPHRINE 0.25% -1:200000 IJ SOLN
INTRAMUSCULAR | Status: DC | PRN
  Administered 2021-08-22: 5 mL
  Administered 2021-08-22: 10 mL

## 2021-08-22 MED ORDER — MIDAZOLAM HCL 2 MG/2ML IJ SOLN
INTRAMUSCULAR | Status: AC
  Filled 2021-08-22: qty 2

## 2021-08-22 MED ORDER — LIDOCAINE 2% (20 MG/ML) 5 ML SYRINGE
INTRAMUSCULAR | Status: DC | PRN
  Administered 2021-08-22: 40 mg via INTRAVENOUS

## 2021-08-22 MED ORDER — ACETAMINOPHEN 500 MG PO TABS: 1000.0000 mg | ORAL_TABLET | ORAL | Status: AC

## 2021-08-22 MED ORDER — ACETAMINOPHEN 500 MG PO TABS
1000.0000 mg | ORAL_TABLET | Freq: Once | ORAL | Status: AC
  Administered 2021-08-22: 1000 mg via ORAL
  Filled 2021-08-22: qty 2

## 2021-08-22 MED ORDER — CELECOXIB 200 MG PO CAPS
200.0000 mg | ORAL_CAPSULE | ORAL | Status: AC
  Administered 2021-08-22: 200 mg via ORAL
  Filled 2021-08-22: qty 1

## 2021-08-22 MED ORDER — FENTANYL CITRATE (PF) 250 MCG/5ML IJ SOLN
INTRAMUSCULAR | Status: DC | PRN
  Administered 2021-08-22: 100 ug via INTRAVENOUS
  Administered 2021-08-22: 50 ug via INTRAVENOUS

## 2021-08-22 SURGICAL SUPPLY — 37 items
BAG COUNTER SPONGE SURGICOUNT (BAG) ×2 IMPLANT
BAG SPNG CNTER NS LX DISP (BAG) ×1
BLADE CLIPPER SURG (BLADE) IMPLANT
CHLORAPREP W/TINT 26 (MISCELLANEOUS) ×2 IMPLANT
COVER SURGICAL LIGHT HANDLE (MISCELLANEOUS) ×2 IMPLANT
DECANTER SPIKE VIAL GLASS SM (MISCELLANEOUS) ×2 IMPLANT
DERMABOND ADVANCED (GAUZE/BANDAGES/DRESSINGS) ×1
DERMABOND ADVANCED .7 DNX12 (GAUZE/BANDAGES/DRESSINGS) ×1 IMPLANT
DRAIN PENROSE 1/2X12 LTX STRL (WOUND CARE) IMPLANT
DRAPE LAPAROSCOPIC ABDOMINAL (DRAPES) ×2 IMPLANT
ELECT REM PT RETURN 9FT ADLT (ELECTROSURGICAL) ×2
ELECTRODE REM PT RTRN 9FT ADLT (ELECTROSURGICAL) ×1 IMPLANT
GLOVE SURG ENC MOIS LTX SZ7.5 (GLOVE) ×2 IMPLANT
GOWN STRL REUS W/ TWL LRG LVL3 (GOWN DISPOSABLE) ×2 IMPLANT
GOWN STRL REUS W/TWL LRG LVL3 (GOWN DISPOSABLE) ×4
KIT BASIN OR (CUSTOM PROCEDURE TRAY) ×2 IMPLANT
KIT TURNOVER KIT B (KITS) ×2 IMPLANT
MESH ULTRAPRO 3X6 7.6X15CM (Mesh General) ×1 IMPLANT
NDL HYPO 25GX1X1/2 BEV (NEEDLE) ×1 IMPLANT
NEEDLE HYPO 25GX1X1/2 BEV (NEEDLE) ×2 IMPLANT
NS IRRIG 1000ML POUR BTL (IV SOLUTION) ×2 IMPLANT
PACK GENERAL/GYN (CUSTOM PROCEDURE TRAY) ×2 IMPLANT
PAD ARMBOARD 7.5X6 YLW CONV (MISCELLANEOUS) ×4 IMPLANT
SUT MNCRL AB 4-0 PS2 18 (SUTURE) ×2 IMPLANT
SUT PROLENE 2 0 SH DA (SUTURE) ×4 IMPLANT
SUT SILK 2 0 SH (SUTURE) IMPLANT
SUT SILK 3 0 (SUTURE) ×2
SUT SILK 3-0 18XBRD TIE 12 (SUTURE) ×1 IMPLANT
SUT VIC AB 0 CT1 27 (SUTURE) ×2
SUT VIC AB 0 CT1 27XBRD ANBCTR (SUTURE) ×1 IMPLANT
SUT VIC AB 2-0 SH 27 (SUTURE) ×2
SUT VIC AB 2-0 SH 27X BRD (SUTURE) ×1 IMPLANT
SUT VIC AB 3-0 SH 27 (SUTURE) ×4
SUT VIC AB 3-0 SH 27XBRD (SUTURE) ×1 IMPLANT
SYR CONTROL 10ML LL (SYRINGE) ×2 IMPLANT
TOWEL GREEN STERILE (TOWEL DISPOSABLE) ×2 IMPLANT
TOWEL GREEN STERILE FF (TOWEL DISPOSABLE) ×2 IMPLANT

## 2021-08-22 NOTE — Interval H&P Note (Signed)
History and Physical Interval Note: ? ?08/22/2021 ?8:09 AM ? ?Cameron Schmitt  has presented today for surgery, with the diagnosis of LEFT INGUINAL HERNIA.  The various methods of treatment have been discussed with the patient and family. After consideration of risks, benefits and other options for treatment, the patient has consented to  Procedure(s): ?LEFT INGUINAL HERNIA REPAIR WITH MESH (Left) as a surgical intervention.  The patient's history has been reviewed, patient examined, no change in status, stable for surgery.  I have reviewed the patient's chart and labs.  Questions were answered to the patient's satisfaction.   ? ? ?Cameron Schmitt ? ? ?

## 2021-08-22 NOTE — Op Note (Signed)
08/22/2021 ? ?10:04 AM ? ?PATIENT:  Cameron Schmitt  61 y.o. male ? ?PRE-OPERATIVE DIAGNOSIS:  LEFT INGUINAL HERNIA ? ?POST-OPERATIVE DIAGNOSIS:  LEFT FEMORAL HERNIA ? ?PROCEDURE:  Procedure(s): ?LEFT FEMORAL HERNIA REPAIR WITH MESH (Left) ? ?SURGEON:  Surgeon(s) and Role: ?   Griselda Miner, MD - Primary ? ?PHYSICIAN ASSISTANT:  ? ?ASSISTANTS: none  ? ?ANESTHESIA:   local and general ? ?EBL:  Minimal  ? ?BLOOD ADMINISTERED:none ? ?DRAINS: none  ? ?LOCAL MEDICATIONS USED:  MARCAINE    ? ?SPECIMEN:  No Specimen ? ?DISPOSITION OF SPECIMEN:  N/A ? ?COUNTS:  YES ? ?TOURNIQUET:  * No tourniquets in log * ? ?DICTATION: .Dragon Dictation ? ?After informed consent was obtained the patient was brought to the operating room and placed in the supine position on the operating table.  After adequate induction of general anesthesia the patient's abdomen and left groin were prepped with ChloraPrep, allowed to dry, and draped in usual sterile manner.  An appropriate timeout was performed.  The left groin area was then infiltrated with quarter percent Marcaine.  A small incision was made from the edge of the pubic tubercle on the left towards the anterior superior iliac spine with a 15 blade knife.  The incision was carried through the skin and subcutaneous tissue sharply with the electrocautery until the fascia of the external oblique was encountered.  A small bridging vein was clamped with hemostats, divided, and ligated with 3-0 silk ties.  The incarcerated hernia appeared to be coming out below the inguinal ligament consistent with a femoral hernia.  I had to divide a small portion of the inguinal ligament in order to create enough space for the hernia to reduce.  The hernia was then repaired by placing a small piece of ultra Pro mesh behind and beneath the inguinal ligament.  The mesh was sewed medially and inferiorly to Cooper's ligament.  The mesh superiorly and laterally was anchored to the inguinal ligament.  Once this was  accomplished the hernia seem to be well repaired and the mesh was seem to be in good position.  The wound was irrigated with saline and infiltrated with more quarter percent Marcaine.  The subcutaneous fascia was then closed with a running 2-0 Vicryl stitch.  The skin was then closed with a running 4-0 Monocryl subcuticular stitch.  Dermabond dressings were applied.  The patient tolerated the procedure well.  At the end of the case all needle sponge and instrument counts were correct.  The patient was then awakened and taken to recovery in stable condition. ? ?PLAN OF CARE: Discharge to home after PACU ? ?PATIENT DISPOSITION:  PACU - hemodynamically stable. ?  ?Delay start of Pharmacological VTE agent (>24hrs) due to surgical blood loss or risk of bleeding: not applicable ? ?

## 2021-08-22 NOTE — Transfer of Care (Signed)
Immediate Anesthesia Transfer of Care Note ? ?Patient: Cameron Schmitt ? ?Procedure(s) Performed: LEFT FEMORAL HERNIA REPAIR WITH MESH (Left) ? ?Patient Location: PACU ? ?Anesthesia Type:General ? ?Level of Consciousness: awake ? ?Airway & Oxygen Therapy: Patient Spontanous Breathing and Patient connected to nasal cannula oxygen ? ?Post-op Assessment: Report given to RN and Post -op Vital signs reviewed and stable ? ?Post vital signs: Reviewed and stable ? ?Last Vitals:  ?Vitals Value Taken Time  ?BP 130/73 08/22/21 1022  ?Temp 36.4 ?C 08/22/21 1022  ?Pulse 80 08/22/21 1028  ?Resp 12 08/22/21 1028  ?SpO2 99 % 08/22/21 1028  ?Vitals shown include unvalidated device data. ? ?Last Pain:  ?Vitals:  ? 08/22/21 1022  ?TempSrc:   ?PainSc: 0-No pain  ?   ? ?Patients Stated Pain Goal: 0 (08/22/21 0717) ? ?Complications: No notable events documented. ?

## 2021-08-22 NOTE — Anesthesia Postprocedure Evaluation (Signed)
Anesthesia Post Note ? ?Patient: Cameron Schmitt ? ?Procedure(s) Performed: LEFT FEMORAL HERNIA REPAIR WITH MESH (Left) ? ?  ? ?Patient location during evaluation: PACU ?Anesthesia Type: General ?Level of consciousness: awake and alert, patient cooperative and oriented ?Pain management: pain level controlled ?Vital Signs Assessment: post-procedure vital signs reviewed and stable ?Respiratory status: spontaneous breathing, nonlabored ventilation and respiratory function stable ?Cardiovascular status: blood pressure returned to baseline and stable ?Postop Assessment: no apparent nausea or vomiting ?Anesthetic complications: no ? ? ?No notable events documented. ? ?Last Vitals:  ?Vitals:  ? 08/22/21 1037 08/22/21 1052  ?BP: 116/77 111/77  ?Pulse: 78 76  ?Resp: 11 11  ?Temp:  36.7 ?C  ?SpO2: 100% 93%  ?  ?Last Pain:  ?Vitals:  ? 08/22/21 1052  ?TempSrc:   ?PainSc: 0-No pain  ? ? ?  ?  ?  ?  ?  ?  ? ?Tegan Burnside,E. Niylah Hassan ? ? ? ? ?

## 2021-08-22 NOTE — H&P (Signed)
?REFERRING PHYSICIAN: Carollee Herter, * ? ?PROVIDER: Lindell Noe, MD ? ?MRN: [id number] ?DOB: 09/24/1960 ?Subjective  ? ?Chief Complaint: New Consultation (Hernia ) ? ? ?History of Present Illness: ?Cameron Schmitt is a 61 y.o. male who is seen today as an office consultation at the request of Dr. Susann Givens for evaluation of New Consultation (Hernia ) ?.  ? ?We are asked to see the patient in consultation by Dr. Sharlot Gowda to evaluate him for a left inguinal hernia. The patient is a 61 year old black male who has noticed a small bulge in the left groin for a number of years. Over the last several months he has had more discomfort associated with it. He feels as though the bulge has gotten larger. He denies any nausea or vomiting. His appetite is good and his bowels move normally. He does not smoke. He does have a remote history of pulmonary embolus and takes a baby aspirin. ? ?Review of Systems: ?A complete review of systems was obtained from the patient. I have reviewed this information and discussed as appropriate with the patient. See HPI as well for other ROS. ? ?ROS  ? ?Medical History: ?Past Medical History:  ?Diagnosis Date  ? DVT (deep venous thrombosis) (CMS-HCC)  ? Hypertension  ? ?Patient Active Problem List  ?Diagnosis  ? Non-recurrent unilateral inguinal hernia without obstruction or gangrene  ? ?History reviewed. No pertinent surgical history.  ? ?No Known Allergies ? ?Current Outpatient Medications on File Prior to Visit  ?Medication Sig Dispense Refill  ? amLODIPine (NORVASC) 5 MG tablet  ? aspirin 81 MG EC tablet Take 81 mg by mouth once daily  ? lisinopriL-hydrochlorothiazide (ZESTORETIC) 20-12.5 mg tablet Take 2 tablets by mouth once daily  ? ?No current facility-administered medications on file prior to visit.  ? ?History reviewed. No pertinent family history.  ? ?Social History  ? ?Tobacco Use  ?Smoking Status Never  ?Smokeless Tobacco Never  ? ? ?Social History  ? ?Socioeconomic History   ? Marital status: Married  ?Tobacco Use  ? Smoking status: Never  ? Smokeless tobacco: Never  ?Substance and Sexual Activity  ? Alcohol use: Not Currently  ? Drug use: Never  ? ?Objective:  ? ?Vitals:  ?BP: (!) 160/80  ?Pulse: 87  ?Temp: 37.1 ?C (98.7 ?F)  ?SpO2: 99%  ?Weight: 73.6 kg (162 lb 3.2 oz)  ?Height: 172.7 cm (5\' 8" )  ? ?Body mass index is 24.66 kg/m?. ? ?Physical Exam ?Constitutional:  ?General: He is not in acute distress. ?Appearance: Normal appearance.  ?HENT:  ?Head: Normocephalic and atraumatic.  ?Right Ear: External ear normal.  ?Left Ear: External ear normal.  ?Nose: Nose normal.  ?Mouth/Throat:  ?Mouth: Mucous membranes are moist.  ?Pharynx: Oropharynx is clear.  ?Eyes:  ?General: No scleral icterus. ?Extraocular Movements: Extraocular movements intact.  ?Conjunctiva/sclera: Conjunctivae normal.  ?Pupils: Pupils are equal, round, and reactive to light.  ?Cardiovascular:  ?Rate and Rhythm: Normal rate and regular rhythm.  ?Pulses: Normal pulses.  ?Heart sounds: Normal heart sounds.  ?Pulmonary:  ?Effort: Pulmonary effort is normal. No respiratory distress.  ?Breath sounds: Normal breath sounds.  ?Abdominal:  ?General: Abdomen is flat. Bowel sounds are normal. There is no distension.  ?Palpations: Abdomen is soft.  ?Tenderness: There is no abdominal tenderness.  ?Genitourinary: ?Comments: There is a moderate sized bulge in the left groin that does not reduce. There is no sign of obstruction. ?Musculoskeletal:  ?General: No swelling or deformity. Normal range of motion.  ?Cervical back: Normal range  of motion and neck supple. No tenderness.  ?Skin: ?General: Skin is warm and dry.  ?Coloration: Skin is not jaundiced.  ?Neurological:  ?General: No focal deficit present.  ?Mental Status: He is alert and oriented to person, place, and time.  ?Psychiatric:  ?Mood and Affect: Mood normal.  ?Behavior: Behavior normal.  ? ? ? ?Labs, Imaging and Diagnostic Testing: ? ?Assessment and Plan:  ? ?Diagnoses and  all orders for this visit: ? ?Non-recurrent unilateral inguinal hernia without obstruction or gangrene ?- [facility] Case Posting Request; Future ? ? ? ?The patient appears to have a moderate a moderate sized left inguinal hernia that is incarcerated but not obstructed. Because of the risk of strangulation I would recommend that he have this hernia repaired. I have discussed with him in detail the risks and benefits of the operation as well as some of the technical aspects including the use of mesh and the risk of chronic pain and he understands and wishes to proceed.  ?

## 2021-08-23 ENCOUNTER — Encounter (HOSPITAL_COMMUNITY): Payer: Self-pay | Admitting: General Surgery

## 2021-09-16 DIAGNOSIS — R7309 Other abnormal glucose: Secondary | ICD-10-CM | POA: Diagnosis not present

## 2021-10-17 DIAGNOSIS — R7309 Other abnormal glucose: Secondary | ICD-10-CM | POA: Diagnosis not present

## 2021-11-16 DIAGNOSIS — R7309 Other abnormal glucose: Secondary | ICD-10-CM | POA: Diagnosis not present

## 2021-12-17 DIAGNOSIS — R7309 Other abnormal glucose: Secondary | ICD-10-CM | POA: Diagnosis not present

## 2022-01-17 DIAGNOSIS — R7309 Other abnormal glucose: Secondary | ICD-10-CM | POA: Diagnosis not present

## 2022-01-22 ENCOUNTER — Encounter: Payer: Self-pay | Admitting: Internal Medicine

## 2022-02-25 ENCOUNTER — Encounter: Payer: Self-pay | Admitting: Internal Medicine

## 2022-03-10 ENCOUNTER — Encounter: Payer: Self-pay | Admitting: Internal Medicine

## 2022-03-21 ENCOUNTER — Ambulatory Visit (INDEPENDENT_AMBULATORY_CARE_PROVIDER_SITE_OTHER): Payer: BC Managed Care – PPO | Admitting: Family Medicine

## 2022-03-21 VITALS — BP 124/80 | HR 64 | Temp 97.4°F | Resp 16 | Wt 165.6 lb

## 2022-03-21 DIAGNOSIS — Z23 Encounter for immunization: Secondary | ICD-10-CM

## 2022-03-21 DIAGNOSIS — R110 Nausea: Secondary | ICD-10-CM

## 2022-03-21 NOTE — Patient Instructions (Signed)
Take 2 Prilosec daily for the next several months and if it takes care of it then went home for a then you can back off

## 2022-03-21 NOTE — Progress Notes (Signed)
   Subjective:    Patient ID: Cameron Schmitt, male    DOB: [birth year], 61 y.o.   MRN: [id number]  HPI He complains of a 1 month history of postprandial nausea and 1 episode of vomiting.  No diarrhea, constipation, abdominal pain.  Apparently food has no effect on this.  His weight is unchanged.  In general his life is going quite well.   Review of Systems     Objective:   Physical Exam Alert and in no distress.  Abdominal exam shows no masses or tenderness with normal bowel sounds.  Heart and lung exam normal.       Assessment & Plan:  Nausea  Need for COVID-19 vaccine - Plan: Pfizer Fall 2023 Covid-19 Vaccine 50yrs and older  Need for influenza vaccination - Plan: Flu Vaccine QUAD 6+ mos PF IM (Fluarix Quad PF) At this time I Minna recommend Prilosec on a regular basis for the next several weeks.  If he continues have difficulty, reevaluation possible blood work and referral.  He was comfortable with that.

## 2022-03-27 ENCOUNTER — Other Ambulatory Visit: Payer: Self-pay | Admitting: Family Medicine

## 2022-03-27 DIAGNOSIS — I10 Essential (primary) hypertension: Secondary | ICD-10-CM

## 2022-03-29 ENCOUNTER — Other Ambulatory Visit: Payer: Self-pay | Admitting: Family Medicine

## 2022-03-29 DIAGNOSIS — I10 Essential (primary) hypertension: Secondary | ICD-10-CM

## 2022-03-31 DIAGNOSIS — H40013 Open angle with borderline findings, low risk, bilateral: Secondary | ICD-10-CM | POA: Diagnosis not present

## 2022-04-08 ENCOUNTER — Encounter: Payer: Self-pay | Admitting: Family Medicine

## 2022-04-08 ENCOUNTER — Ambulatory Visit (INDEPENDENT_AMBULATORY_CARE_PROVIDER_SITE_OTHER): Payer: BC Managed Care – PPO | Admitting: Family Medicine

## 2022-04-08 VITALS — BP 128/78 | HR 72 | Ht 68.0 in | Wt 166.0 lb

## 2022-04-08 DIAGNOSIS — Z1211 Encounter for screening for malignant neoplasm of colon: Secondary | ICD-10-CM

## 2022-04-08 DIAGNOSIS — R110 Nausea: Secondary | ICD-10-CM

## 2022-04-08 NOTE — Progress Notes (Signed)
He never had an office before here having  Subjective:    Patient ID: Cameron Schmitt, male    DOB: December 22, 1960, 61 y.o.   MRN: [id number]  HPI He is here for a recheck.  He did use Prilosec for 2 weeks and had no difficulty with nausea or vomiting.  After that 2 weeks he started having difficulty with nausea that could be associated with any particular food.  He has had no abdominal pain, constipation and only 1 episode of vomiting since he stopped the medication.   Review of Systems     Objective:   Physical Exam Alert and in no distress.  Abdominal exam shows decreased bowel sounds without masses or tenderness.       Assessment & Plan:  Screening for colon cancer - Plan: Cologuard  Nausea Recommended the use of Prilosec for 1 month.  When he stops if his symptoms recur, I will refer to gastroenterology.  He was comfortable with that. Cologuard results ordered.

## 2022-04-21 DIAGNOSIS — Z1211 Encounter for screening for malignant neoplasm of colon: Secondary | ICD-10-CM | POA: Diagnosis not present

## 2022-04-25 ENCOUNTER — Other Ambulatory Visit: Payer: Self-pay | Admitting: Family Medicine

## 2022-04-25 DIAGNOSIS — I10 Essential (primary) hypertension: Secondary | ICD-10-CM

## 2022-05-01 LAB — COLOGUARD: COLOGUARD: NEGATIVE

## 2022-06-19 ENCOUNTER — Encounter: Payer: Self-pay | Admitting: Family Medicine

## 2022-06-19 ENCOUNTER — Ambulatory Visit (INDEPENDENT_AMBULATORY_CARE_PROVIDER_SITE_OTHER): Payer: BC Managed Care – PPO | Admitting: Family Medicine

## 2022-06-19 VITALS — BP 134/82 | HR 72 | Temp 97.9°F | Resp 16 | Ht 67.0 in | Wt 159.0 lb

## 2022-06-19 DIAGNOSIS — Z0000 Encounter for general adult medical examination without abnormal findings: Secondary | ICD-10-CM | POA: Diagnosis not present

## 2022-06-19 DIAGNOSIS — I10 Essential (primary) hypertension: Secondary | ICD-10-CM | POA: Diagnosis not present

## 2022-06-19 LAB — LIPID PANEL

## 2022-06-19 LAB — POCT URINALYSIS DIP (PROADVANTAGE DEVICE)
Bilirubin, UA: NEGATIVE
Blood, UA: NEGATIVE
Glucose, UA: NEGATIVE mg/dL
Ketones, POC UA: NEGATIVE mg/dL
Leukocytes, UA: NEGATIVE
Nitrite, UA: NEGATIVE
Protein Ur, POC: NEGATIVE mg/dL
Specific Gravity, Urine: 1.01
Urobilinogen, Ur: 0.2
pH, UA: 7.5 (ref 5.0–8.0)

## 2022-06-19 MED ORDER — AMLODIPINE BESYLATE 5 MG PO TABS: ORAL_TABLET | ORAL | 0 refills | Status: DC

## 2022-06-19 MED ORDER — LISINOPRIL-HYDROCHLOROTHIAZIDE 20-12.5 MG PO TABS: 2.0000 | ORAL_TABLET | Freq: Every day | ORAL | 0 refills | Status: DC

## 2022-06-19 NOTE — Progress Notes (Signed)
Complete physical exam  Patient: Cameron Schmitt   DOB: 04-Dec-1960   62 y.o. Male  MRN: [id number]  Subjective:    Chief Complaint  Patient presents with   Annual Exam    Fasting. No additional concerns.     DRURY ARDIZZONE is a 61 y.o. male who presents today for a complete physical exam. He reports consuming a general diet. Home exercise routine includes push ups. He generally feels well. He reports sleeping fairly well. He does not have additional problems to discuss today.  He continues on lisinopril and Norvasc and is having no difficulty with that.  He did have difficulty with nausea and the Prilosec did help with that.  Since then he has had a couple of episodes of nausea and vomiting but cannot relate this to anything in particular.  Work is going well.  His home life seems to be stable.  Otherwise his family and social history as well as health maintenance and immunizations was reviewed.   Most recent fall risk assessment:    06/19/2022    1:57 PM  [place] in the past year? 0  Number falls in past yr: 0  Injury with Fall? 0  Risk for fall due to : No Fall Risks  Follow up Falls evaluation completed     Most recent depression screenings:    06/19/2022    1:57 PM 04/08/2022   10:04 AM  PHQ 2/9 Scores  PHQ - 2 Score 0 0    Vision:Within last year and Dental: No current dental problems and Receives regular dental care    Patient Care Team: Denita Lung, MD as PCP - General (Family Medicine)   Outpatient Medications Prior to Visit  Medication Sig   aspirin 81 MG tablet Take 81 mg by mouth daily.   Multiple Vitamin (MULTIVITAMIN WITH MINERALS) TABS tablet Take 1 tablet by mouth daily.   [DISCONTINUED] amLODipine (NORVASC) 5 MG tablet TAKE 1 TABLET(5 MG) BY MOUTH DAILY   [DISCONTINUED] lisinopril-hydrochlorothiazide (ZESTORETIC) 20-12.5 MG tablet TAKE 2 TABLETS BY MOUTH DAILY   Omeprazole Magnesium (PRILOSEC PO) Take 1 tablet by mouth daily. (Patient not  taking: Reported on 04/08/2022)   No facility-administered medications prior to visit.    Review of Systems  All other systems reviewed and are negative.         Objective:     BP 134/82   Pulse 72   Temp 97.9 F (36.6 C) (Oral)   Resp 16   Ht 5\' 7"  (1.702 m)   Wt 159 lb (72.1 kg)   SpO2 99% Comment: room air  BMI 24.90 kg/m    Physical Exam  Alert and in no distress. Tympanic membranes and canals are normal. Pharyngeal area is normal. Neck is supple without adenopathy or thyromegaly. Cardiac exam shows a regular sinus rhythm without murmurs or gallops. Lungs are clear to auscultation.  Results for orders placed or performed in visit on 06/19/22  POCT Urinalysis DIP (Proadvantage Device)  Result Value Ref Range   Color, UA yellow yellow   Clarity, UA clear clear   Glucose, UA negative negative mg/dL   Bilirubin, UA negative negative   Ketones, POC UA negative negative mg/dL   Specific Gravity, Urine 1.010    Blood, UA negative negative   pH, UA 7.5 5.0 - 8.0   Protein Ur, POC negative negative mg/dL   Urobilinogen, Ur 0.2    Nitrite, UA Negative Negative   Leukocytes, UA  Negative Negative       Assessment & Plan:    Routine Health Maintenance and Physical Exam  Immunization History  Administered Date(s) Administered   COVID-19, mRNA, vaccine(Comirnaty)12 years and older 03/21/2022   Influenza,inj,Quad PF,6+ Mos 03/21/2022   Moderna Sars-Covid-2 Vaccination 08/08/2019, 09/05/2019, 05/14/2020   Tdap 05/19/2009, 10/19/2019   Zoster Recombinat (Shingrix) 02/05/2021, 04/08/2021    Health Maintenance  Topic Date Due   HIV Screening  Never done   Fecal DNA (Cologuard)  04/21/2025   DTaP/Tdap/Td (3 - Td or Tdap) 10/18/2029   INFLUENZA VACCINE  Completed   COVID-19 Vaccine  Completed   Hepatitis C Screening  Completed   Zoster Vaccines- Shingrix  Completed   HPV VACCINES  Aged Out   COLONOSCOPY (Pts 45-48yrs Insurance coverage will need to be confirmed)   Discontinued    I encouraged him to continue to take good care of himself. Problem List Items Addressed This Visit     Essential hypertension   Relevant Medications   lisinopril-hydrochlorothiazide (ZESTORETIC) 20-12.5 MG tablet   amLODipine (NORVASC) 5 MG tablet   Other Visit Diagnoses     Routine general medical examination at a health care facility    -  Primary   Relevant Orders   CBC with Differential/Platelet   Comprehensive metabolic panel   Lipid panel   POCT Urinalysis DIP (Proadvantage Device) (Completed)      Recheck 1 year    Jill Alexanders, MD

## 2022-06-20 LAB — COMPREHENSIVE METABOLIC PANEL
ALT: 23 IU/L (ref 0–44)
AST: 21 IU/L (ref 0–40)
Albumin/Globulin Ratio: 1.5 (ref 1.2–2.2)
Albumin: 4.7 g/dL (ref 3.9–4.9)
Alkaline Phosphatase: 77 IU/L (ref 44–121)
BUN/Creatinine Ratio: 14 (ref 10–24)
BUN: 13 mg/dL (ref 8–27)
Bilirubin Total: 0.6 mg/dL (ref 0.0–1.2)
CO2: 29 mmol/L (ref 20–29)
Calcium: 10.2 mg/dL (ref 8.6–10.2)
Chloride: 98 mmol/L (ref 96–106)
Creatinine, Ser: 0.9 mg/dL (ref 0.76–1.27)
Globulin, Total: 3.1 g/dL (ref 1.5–4.5)
Glucose: 77 mg/dL (ref 70–99)
Potassium: 4.2 mmol/L (ref 3.5–5.2)
Sodium: 139 mmol/L (ref 134–144)
Total Protein: 7.8 g/dL (ref 6.0–8.5)
eGFR: 97 mL/min/{1.73_m2} (ref >59)

## 2022-06-20 LAB — LIPID PANEL
Chol/HDL Ratio: 4.8 ratio (ref 0.0–5.0)
Cholesterol, Total: 209 mg/dL — ABNORMAL HIGH (ref 100–199)
HDL: 44 mg/dL (ref >39)
LDL Chol Calc (NIH): 152 mg/dL — ABNORMAL HIGH (ref 0–99)
Triglycerides: 71 mg/dL (ref 0–149)
VLDL Cholesterol Cal: 13 mg/dL (ref 5–40)

## 2022-06-20 LAB — CBC WITH DIFFERENTIAL/PLATELET
Basophils Absolute: 0 10*3/uL (ref 0.0–0.2)
Basos: 1 %
EOS (ABSOLUTE): 0.3 10*3/uL (ref 0.0–0.4)
Eos: 9 %
Hematocrit: 43.5 % (ref 37.5–51.0)
Hemoglobin: 14.4 g/dL (ref 13.0–17.7)
Immature Grans (Abs): 0 10*3/uL (ref 0.0–0.1)
Immature Granulocytes: 0 %
Lymphocytes Absolute: 1.2 10*3/uL (ref 0.7–3.1)
Lymphs: 35 %
MCH: 25.6 pg — ABNORMAL LOW (ref 26.6–33.0)
MCHC: 33.1 g/dL (ref 31.5–35.7)
MCV: 77 fL — ABNORMAL LOW (ref 79–97)
Monocytes Absolute: 0.4 10*3/uL (ref 0.1–0.9)
Monocytes: 12 %
Neutrophils Absolute: 1.5 10*3/uL (ref 1.4–7.0)
Neutrophils: 43 %
Platelets: 282 10*3/uL (ref 150–450)
RBC: 5.62 x10E6/uL (ref 4.14–5.80)
RDW: 12.6 % (ref 11.6–15.4)
WBC: 3.4 10*3/uL (ref 3.4–10.8)

## 2022-07-24 ENCOUNTER — Other Ambulatory Visit: Payer: Self-pay | Admitting: Family Medicine

## 2022-07-24 DIAGNOSIS — I10 Essential (primary) hypertension: Secondary | ICD-10-CM

## 2022-09-16 ENCOUNTER — Ambulatory Visit: Payer: BC Managed Care – PPO | Admitting: Family Medicine

## 2022-09-18 ENCOUNTER — Other Ambulatory Visit: Payer: Self-pay | Admitting: Family Medicine

## 2022-09-18 DIAGNOSIS — I10 Essential (primary) hypertension: Secondary | ICD-10-CM

## 2022-10-20 IMAGING — MR MR CERVICAL SPINE W/O CM
5 series · 34 of 48 positions shown · non-contrast
Comparison: MRI from 01/08/2013.

CLINICAL DATA: Initial evaluation for cervical radiculopathy, right
upper extremity numbness and tingling.

EXAM:
MRI CERVICAL SPINE WITHOUT CONTRAST
TECHNIQUE: Multiplanar, multisequence MR imaging of the cervical spine was
performed. No intravenous contrast was administered.

[Series 2: T2 · sagittal · 3.0mm · 0.41mm/px · 8 of 16 slices shown (1 of 2)]
[im 1/16]
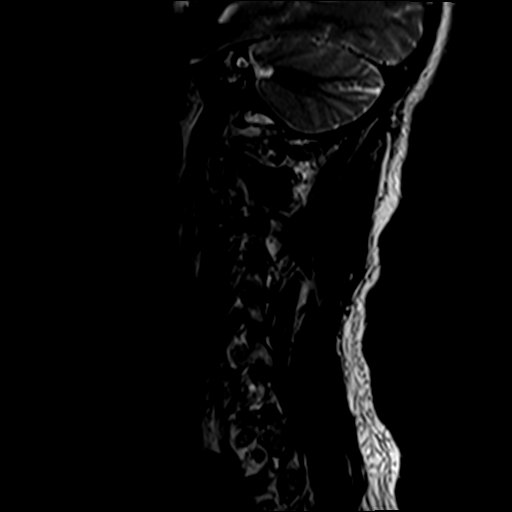
[im 3/16]
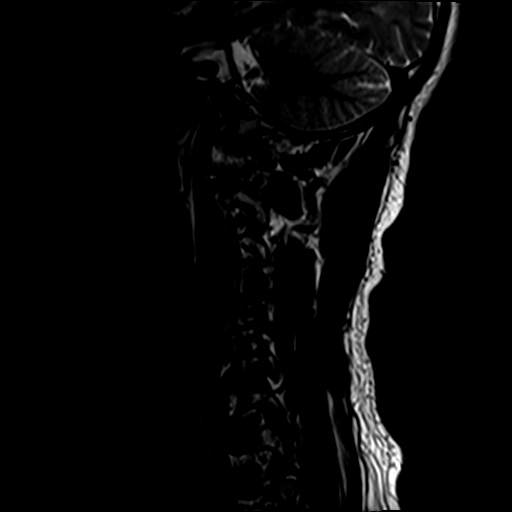
[im 5/16]
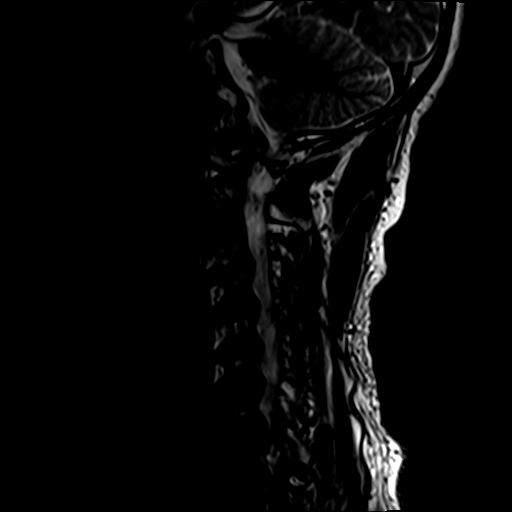
[im 7/16]
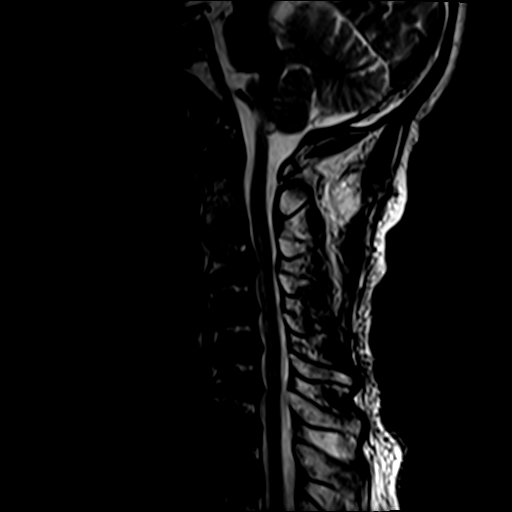
[im 9/16]
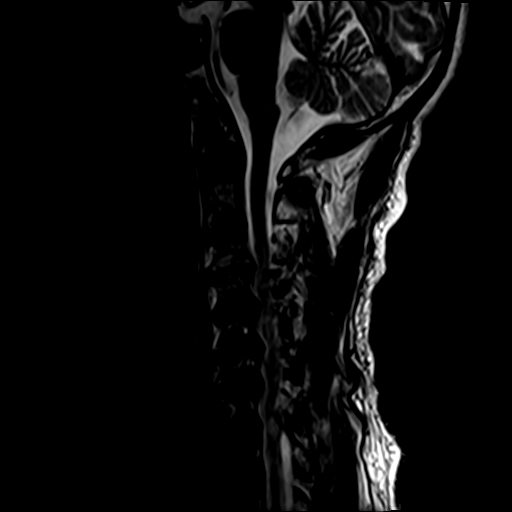
[im 11/16]
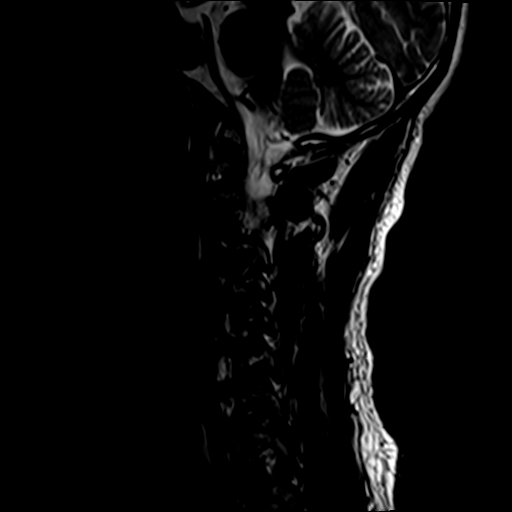
[im 13/16]
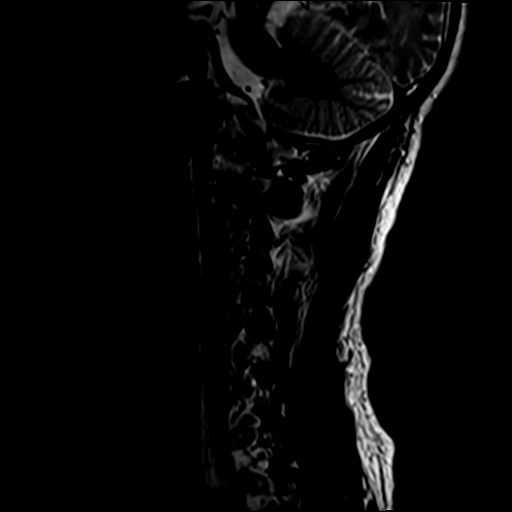
[im 16/16]
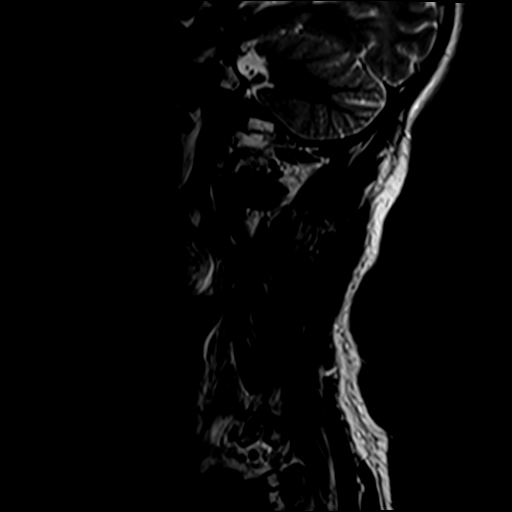

[Series 3: STIR · sagittal · 3.0mm · 0.82mm/px · 7 of 16 slices shown]
[im 1/16]
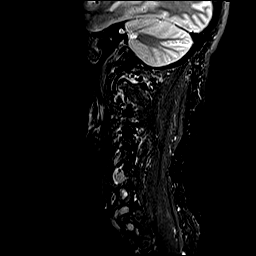
[im 3/16]
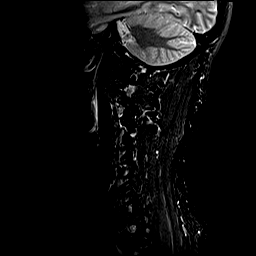
[im 6/16]
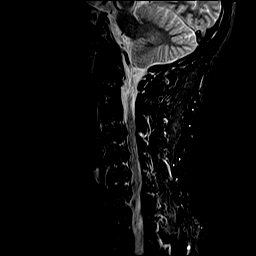
[im 8/16]
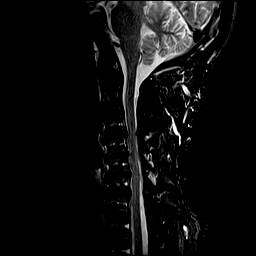
[im 11/16]
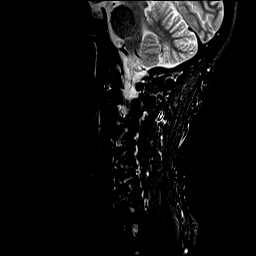
[im 13/16]
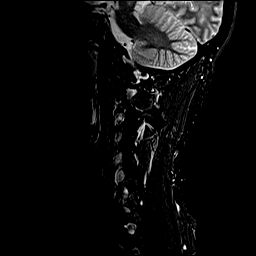
[im 16/16]
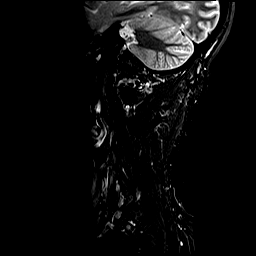

[Series 4: T1 · sagittal · 3.0mm · 0.82mm/px · 7 of 16 slices shown]
[im 1/16]
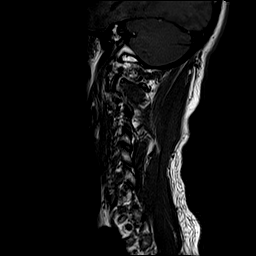
[im 3/16]
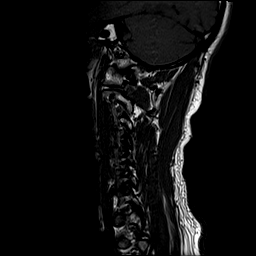
[im 6/16]
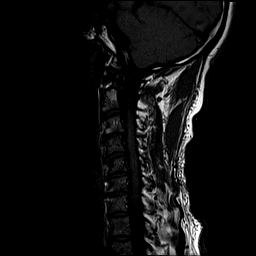
[im 8/16]
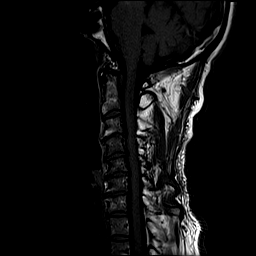
[im 11/16]
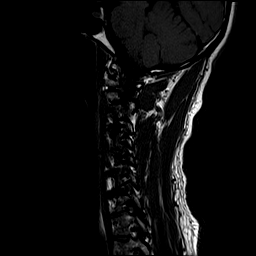
[im 13/16]
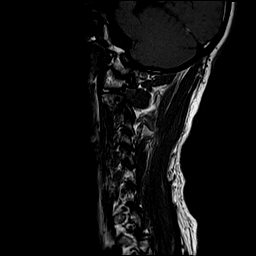
[im 16/16]
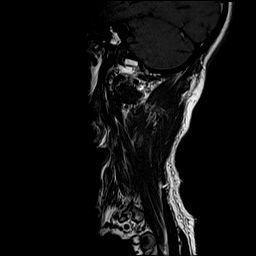

[Series 5: T2 · axial · 3.0mm · 0.70mm/px · z∈[-94,+6]mm · 9 of 28 slices shown (2 of 2)]
[im 1/28]
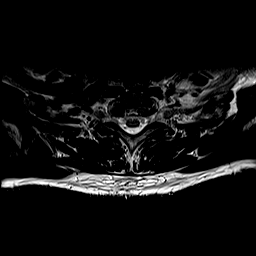
[im 5/28]
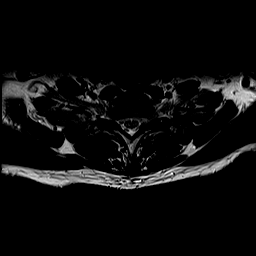
[im 10/28]
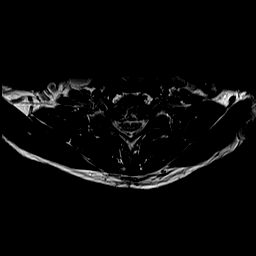
[im 12/28]
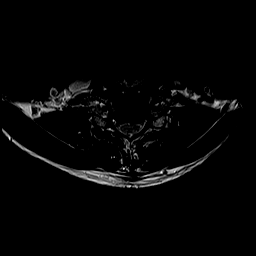
[im 14/28]
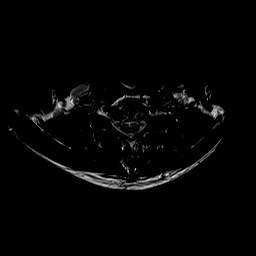
[im 16/28]
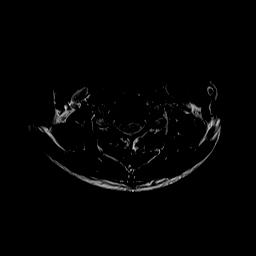
[im 19/28]
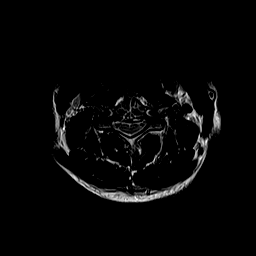
[im 23/28]
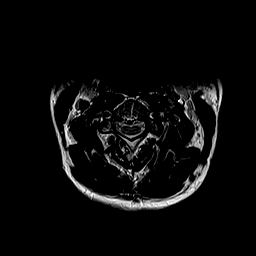
[im 28/28]
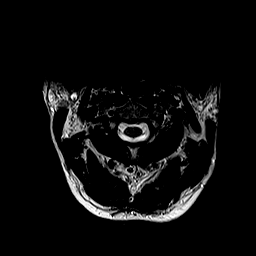

[Series 6: GRE · axial · 3.0mm · 0.35mm/px · z∈[-94,-61]mm · 3 of 28 slices shown]
[im 1/28]
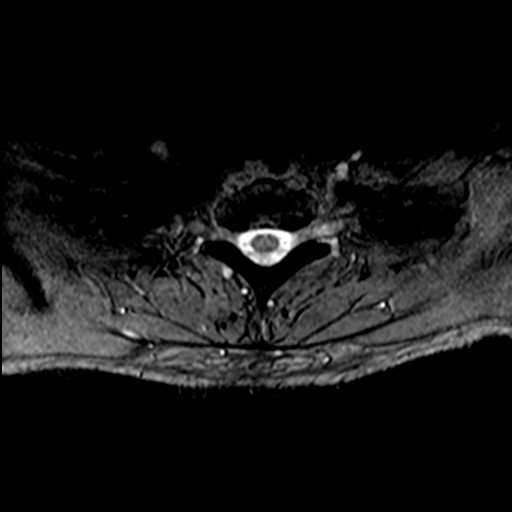
[im 5/28]
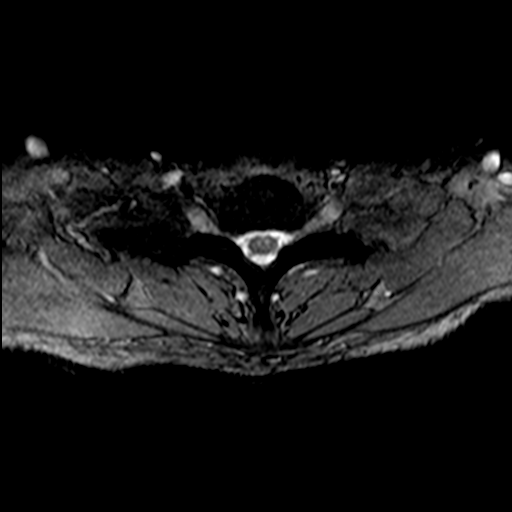
[im 10/28]
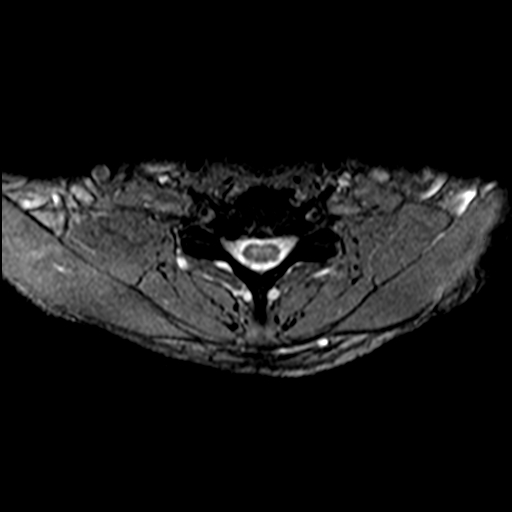

[34 of 48 positions shown; findings below may reference images not displayed]

FINDINGS: Alignment: Straightening of the normal cervical lordosis. No
listhesis.

Vertebrae: Vertebral body height maintained without acute or chronic
fracture. Bone marrow signal intensity diffusely heterogeneous
without discrete or worrisome osseous lesion. No abnormal marrow
edema.

Cord: Normal signal and morphology.

Posterior Fossa, vertebral arteries, paraspinal tissues:
Unremarkable.

Disc levels:

C2-C3: Shallow central disc protrusion mildly indents the ventral
thecal sac. Mild facet spurring. No canal or foraminal stenosis.

C3-C4: Moderate intervertebral disc space narrowing, progressed from
prior. Associated diffuse disc osteophyte complex with bilateral
uncovertebral spurring. Flattening and partial effacement of the
ventral thecal sac with mild spinal stenosis. Mild cord flattening
without cord signal changes. Moderate left C4 foraminal stenosis.
Right neural foramen remains patent.

C4-C5: Diffuse disc bulge with endplate and uncovertebral spurring.
Superimposed right paracentral disc protrusion indents the ventral
thecal sac (series 6, image 13). Minimal flattening of the ventral
cord without cord signal changes. Mild spinal stenosis. Moderate
bilateral C5 foraminal stenosis.

C5-C6: Mild intervertebral disc space narrowing with diffuse disc
bulge. Associated endplate and uncovertebral spurring. Flattening of
the ventral thecal sac with resultant mild spinal stenosis. Minimal
cord flattening without cord signal changes. Moderate bilateral C6
foraminal narrowing.

C6-C7: Degenerative intervertebral disc space narrowing with
circumferential disc osteophyte complex. Broad posterior component
flattens and partially faces the ventral thecal sac with mild spinal
stenosis. No cord impingement. Severe right worse than left C7
foraminal stenosis.

C7-T1: Mild disc bulge. Bilateral facet hypertrophy. No spinal
stenosis. Mild to moderate bilateral C8 foraminal narrowing.

Visualized upper thoracic spine demonstrates no significant finding.
IMPRESSION: 1. Multilevel cervical spondylosis with resultant mild diffuse
spinal stenosis at C3-4 through C6-7. Specific note made of a right
paracentral disc protrusion at C4-5, which could potentially
contribute to right-sided symptoms.
2. Multifactorial degenerative changes with resultant multilevel
foraminal narrowing as above. Notable findings include moderate left
C4 foraminal stenosis, moderate bilateral C5 and C6 foraminal
narrowing, with severe right worse than left C7 foraminal stenosis.

## 2022-10-21 ENCOUNTER — Other Ambulatory Visit: Payer: Self-pay | Admitting: Family Medicine

## 2022-10-21 DIAGNOSIS — I10 Essential (primary) hypertension: Secondary | ICD-10-CM

## 2022-12-18 ENCOUNTER — Encounter: Payer: Self-pay | Admitting: Family Medicine

## 2022-12-18 ENCOUNTER — Ambulatory Visit (INDEPENDENT_AMBULATORY_CARE_PROVIDER_SITE_OTHER): Payer: BC Managed Care – PPO | Admitting: Family Medicine

## 2022-12-18 VITALS — BP 118/66 | HR 79 | Temp 98.0°F | Wt 161.6 lb

## 2022-12-18 DIAGNOSIS — L0231 Cutaneous abscess of buttock: Secondary | ICD-10-CM | POA: Diagnosis not present

## 2022-12-18 DIAGNOSIS — I10 Essential (primary) hypertension: Secondary | ICD-10-CM

## 2022-12-18 MED ORDER — LISINOPRIL-HYDROCHLOROTHIAZIDE 20-12.5 MG PO TABS: 2.0000 | ORAL_TABLET | Freq: Every day | ORAL | 3 refills | Status: DC

## 2022-12-18 NOTE — Patient Instructions (Signed)
On Saturday pull out about half of the packing and then on Sunday pull out the other packing and then start irrigating a couple times per day and when you put a Band-Aid on it put a little bit of the triple antibiotic ointment on the wound to keep open

## 2022-12-18 NOTE — Progress Notes (Signed)
   Subjective:    Patient ID: Cameron Schmitt, male    DOB: 02/14/1961, 62 y.o.   MRN: [id number]  HPI He states that he has had difficulty last 4 months with an abscess that intermittently drains and he is here for further evaluation and treatment of that.  He also needs a refill on his blood pressure medication.   Review of Systems     Objective:    Physical Exam Exam of the right gluteal area does show some slight drainage and 4 cm of induration around that but it is not hot and only minimally tender.       Assessment & Plan:   Problem List Items Addressed This Visit     Essential hypertension   Relevant Medications   lisinopril-hydrochlorothiazide (ZESTORETIC) 20-12.5 MG tablet   Other Visit Diagnoses     Gluteal abscess    -  Primary     The abscess was injected with Xylocaine and epinephrine.  A 3 cm incision was made.  The wound was open but very little purulent material was removed and no evidence of sebaceous gland.  There was a slight dimple near that which is probably where the sebaceous glands started.  The wound was explored and packed with iodoform.  He is to leave the iodoform in for about 2 days and then remove half of it and half the next day then start irrigating it.  Explained that this could easily recur and if it does to come in much sooner.

## 2023-01-17 ENCOUNTER — Other Ambulatory Visit: Payer: Self-pay | Admitting: Family Medicine

## 2023-01-17 DIAGNOSIS — I10 Essential (primary) hypertension: Secondary | ICD-10-CM

## 2023-03-17 ENCOUNTER — Other Ambulatory Visit: Payer: Self-pay | Admitting: Family Medicine

## 2023-03-17 DIAGNOSIS — I10 Essential (primary) hypertension: Secondary | ICD-10-CM

## 2023-04-24 DIAGNOSIS — K08409 Partial loss of teeth, unspecified cause, unspecified class: Secondary | ICD-10-CM | POA: Diagnosis not present

## 2023-04-24 DIAGNOSIS — J0100 Acute maxillary sinusitis, unspecified: Secondary | ICD-10-CM | POA: Diagnosis not present

## 2023-04-26 ENCOUNTER — Other Ambulatory Visit: Payer: Self-pay | Admitting: Family Medicine

## 2023-04-26 DIAGNOSIS — I10 Essential (primary) hypertension: Secondary | ICD-10-CM

## 2023-07-14 ENCOUNTER — Encounter: Payer: Self-pay | Admitting: Internal Medicine

## 2023-07-25 ENCOUNTER — Other Ambulatory Visit: Payer: Self-pay | Admitting: Family Medicine

## 2023-07-25 DIAGNOSIS — I10 Essential (primary) hypertension: Secondary | ICD-10-CM

## 2023-09-14 ENCOUNTER — Other Ambulatory Visit: Payer: Self-pay | Admitting: Family Medicine

## 2023-09-14 DIAGNOSIS — I10 Essential (primary) hypertension: Secondary | ICD-10-CM

## 2023-09-23 ENCOUNTER — Encounter: Payer: Self-pay | Admitting: Family Medicine

## 2023-09-23 ENCOUNTER — Ambulatory Visit (INDEPENDENT_AMBULATORY_CARE_PROVIDER_SITE_OTHER): Admitting: Family Medicine

## 2023-09-23 VITALS — BP 128/80 | HR 95 | Wt 162.6 lb

## 2023-09-23 DIAGNOSIS — I10 Essential (primary) hypertension: Secondary | ICD-10-CM

## 2023-09-23 DIAGNOSIS — K4090 Unilateral inguinal hernia, without obstruction or gangrene, not specified as recurrent: Secondary | ICD-10-CM

## 2023-09-23 DIAGNOSIS — D708 Other neutropenia: Secondary | ICD-10-CM | POA: Diagnosis not present

## 2023-09-23 DIAGNOSIS — Z13220 Encounter for screening for lipoid disorders: Secondary | ICD-10-CM

## 2023-09-23 DIAGNOSIS — Z23 Encounter for immunization: Secondary | ICD-10-CM

## 2023-09-23 DIAGNOSIS — Z9889 Other specified postprocedural states: Secondary | ICD-10-CM | POA: Diagnosis not present

## 2023-09-23 DIAGNOSIS — Z0000 Encounter for general adult medical examination without abnormal findings: Secondary | ICD-10-CM | POA: Diagnosis not present

## 2023-09-23 DIAGNOSIS — Z8719 Personal history of other diseases of the digestive system: Secondary | ICD-10-CM

## 2023-09-23 LAB — LIPID PANEL

## 2023-09-23 MED ORDER — AMLODIPINE BESYLATE 5 MG PO TABS: ORAL_TABLET | ORAL | 1 refills | Status: DC

## 2023-09-23 MED ORDER — LISINOPRIL-HYDROCHLOROTHIAZIDE 20-12.5 MG PO TABS
2.0000 | ORAL_TABLET | Freq: Every day | ORAL | 0 refills | Status: DC
Start: 2023-09-23 — End: 2023-12-30

## 2023-09-23 NOTE — Progress Notes (Signed)
   Subjective:    Patient ID: KONRAD DESAI, male    DOB: June 04, 1960, 63 y.o.   MRN: [id number]  HPI He is here for complete examination.  He has no particular concerns or complaints.  Continues on amlodipine  and lisinopril /HCTZ.  He is also not using omeprazole anymore.  He did have a hernia repair last year.  He does not smoke or drink.  Work and home life are going well.  Family and social history as well as health maintenance and immunizations was reviewed.   Review of Systems  HENT:  Ear discharge: .jl.   All other systems reviewed and are negative.      Objective:    Physical Exam Alert and in no distress. Tympanic membranes and canals are normal. Pharyngeal area is normal. Neck is supple without adenopathy or thyromegaly. Cardiac exam shows a regular sinus rhythm without murmurs or gallops. Lungs are clear to auscultation.        Assessment & Plan:  Routine general medical examination at a health care facility  Other neutropenia (HCC) - Plan: CBC with Differential/Platelet  Essential hypertension - Plan: CBC with Differential/Platelet, Comprehensive metabolic panel with GFR, amLODipine  (NORVASC ) 5 MG tablet, lisinopril -hydrochlorothiazide  (ZESTORETIC ) 20-12.5 MG tablet  S/P left inguinal herniorrhaphy  Screening for lipid disorders - Plan: Lipid panel  Need for vaccination against Streptococcus pneumoniae - Plan: Pneumococcal conjugate vaccine 20-valent (Prevnar 20)  His medications were renewed.  I encouraged him to continue to take good care of himself.

## 2023-09-24 ENCOUNTER — Encounter: Payer: Self-pay | Admitting: Family Medicine

## 2023-09-24 LAB — COMPREHENSIVE METABOLIC PANEL WITH GFR
ALT: 23 IU/L (ref 0–44)
AST: 21 IU/L (ref 0–40)
Albumin: 4.3 g/dL (ref 3.9–4.9)
Alkaline Phosphatase: 78 IU/L (ref 44–121)
BUN/Creatinine Ratio: 14 (ref 10–24)
BUN: 14 mg/dL (ref 8–27)
Bilirubin Total: 0.3 mg/dL (ref 0.0–1.2)
CO2: 26 mmol/L (ref 20–29)
Calcium: 9.9 mg/dL (ref 8.6–10.2)
Chloride: 99 mmol/L (ref 96–106)
Creatinine, Ser: 1.02 mg/dL (ref 0.76–1.27)
Globulin, Total: 3.5 g/dL (ref 1.5–4.5)
Glucose: 89 mg/dL (ref 70–99)
Potassium: 4 mmol/L (ref 3.5–5.2)
Sodium: 140 mmol/L (ref 134–144)
Total Protein: 7.8 g/dL (ref 6.0–8.5)
eGFR: 83 mL/min/{1.73_m2} (ref >59)

## 2023-09-24 LAB — CBC WITH DIFFERENTIAL/PLATELET
Basophils Absolute: 0 10*3/uL (ref 0.0–0.2)
Basos: 1 %
EOS (ABSOLUTE): 0.2 10*3/uL (ref 0.0–0.4)
Eos: 6 %
Hematocrit: 43.9 % (ref 37.5–51.0)
Hemoglobin: 14.2 g/dL (ref 13.0–17.7)
Immature Grans (Abs): 0 10*3/uL (ref 0.0–0.1)
Immature Granulocytes: 0 %
Lymphocytes Absolute: 1.3 10*3/uL (ref 0.7–3.1)
Lymphs: 42 %
MCH: 25.4 pg — ABNORMAL LOW (ref 26.6–33.0)
MCHC: 32.3 g/dL (ref 31.5–35.7)
MCV: 79 fL (ref 79–97)
Monocytes Absolute: 0.4 10*3/uL (ref 0.1–0.9)
Monocytes: 12 %
Neutrophils Absolute: 1.2 10*3/uL — ABNORMAL LOW (ref 1.4–7.0)
Neutrophils: 39 %
Platelets: 281 10*3/uL (ref 150–450)
RBC: 5.59 x10E6/uL (ref 4.14–5.80)
RDW: 13.3 % (ref 11.6–15.4)
WBC: 3.1 10*3/uL — ABNORMAL LOW (ref 3.4–10.8)

## 2023-09-24 LAB — LIPID PANEL
Cholesterol, Total: 206 mg/dL — ABNORMAL HIGH (ref 100–199)
HDL: 41 mg/dL (ref >39)
LDL CALC COMMENT:: 5 ratio (ref 0.0–5.0)
LDL Chol Calc (NIH): 135 mg/dL — ABNORMAL HIGH (ref 0–99)
Triglycerides: 168 mg/dL — ABNORMAL HIGH (ref 0–149)
VLDL Cholesterol Cal: 30 mg/dL (ref 5–40)

## 2023-10-22 ENCOUNTER — Ambulatory Visit: Payer: Self-pay | Admitting: *Deleted

## 2023-10-22 NOTE — Telephone Encounter (Signed)
 Left voicemail for patient

## 2023-10-22 NOTE — Telephone Encounter (Signed)
  Is it possible this pt can be seen sooner?  Scheduled for 6/10 with Dr. Robina Chol.       FYI Only or Action Required?: Action required by provider  Patient was last seen in primary care on 09/23/2023 by Watson Hacking, MD. Called Nurse Triage reporting Dizziness. Symptoms began a week ago. Interventions attempted: Nothing. Symptoms are: gradually worsening .  Triage Disposition: See Physician Within 24 Hours  Patient/caregiver understands and will follow disposition?:  Yes  Copied from CRM [phone] [id number]. Topic: Clinical - Red Word Triage >> Oct 22, 2023  2:44 PM BJYNWGN D wrote: Patient is experiencing light headedness and dizziness when doing things like coughing, sneezing, or anything that causes him to use energy. Like bending over. Reason for Disposition  [1] MODERATE dizziness (e.g., interferes with normal activities) AND [2] has NOT been evaluated by doctor (or NP/PA) for this  (Exception: Dizziness caused by heat exposure, sudden standing, or poor fluid intake.)  Answer Assessment - Initial Assessment Questions 1. DESCRIPTION: "Describe your dizziness."     I'm having dizziness when I exert myself, like bending over, lift something, etc.   Just standing or walking I'm fine.   But if I exert energy I get light headed.   Started a week ago.  Today for the first time I felt a little nausea briefly.   No headaches. 2. LIGHTHEADED: "Do you feel lightheaded?" (e.g., somewhat faint, woozy, weak upon standing)     Yes with exertion 3. VERTIGO: "Do you feel like either you or the room is spinning or tilting?" (i.e. vertigo)     No 4. SEVERITY: "How bad is it?"  "Do you feel like you are going to faint?" "Can you stand and walk?"   - MILD: Feels slightly dizzy, but walking normally.   - MODERATE: Feels unsteady when walking, but not falling; interferes with normal activities (e.g., school, work).   - SEVERE: Unable to walk without falling, or requires assistance to walk without falling; feels like  passing out now.      Moderate 5. ONSET:  "When did the dizziness begin?"     A week ago 6. AGGRAVATING FACTORS: "Does anything make it worse?" (e.g., standing, change in head position)     Exerting myself. 7. HEART RATE: "Can you tell me your heart rate?" "How many beats in 15 seconds?"  (Note: not all patients can do this)       Not asked  8. CAUSE: "What do you think is causing the dizziness?"     I don't know 9. RECURRENT SYMPTOM: "Have you had dizziness before?" If Yes, ask: "When was the last time?" "What happened that time?"     No 10. OTHER SYMPTOMS: "Do you have any other symptoms?" (e.g., fever, chest pain, vomiting, diarrhea, bleeding)       Nausea briefly today. 11. PREGNANCY: "Is there any chance you are pregnant?" "When was your last menstrual period?"       N/A  Protocols used: Dizziness - Lightheadedness-A-AH

## 2023-10-23 ENCOUNTER — Ambulatory Visit: Admitting: Medical

## 2023-10-23 ENCOUNTER — Encounter: Payer: Self-pay | Admitting: Medical

## 2023-10-23 VITALS — Temp 97.7°F | Ht 68.0 in | Wt 150.6 lb

## 2023-10-23 DIAGNOSIS — E860 Dehydration: Secondary | ICD-10-CM

## 2023-10-23 DIAGNOSIS — R42 Dizziness and giddiness: Secondary | ICD-10-CM | POA: Diagnosis not present

## 2023-10-23 DIAGNOSIS — I10 Essential (primary) hypertension: Secondary | ICD-10-CM

## 2023-10-23 MED ORDER — MECLIZINE HCL 12.5 MG PO TABS: 12.5000 mg | ORAL_TABLET | Freq: Two times a day (BID) | ORAL | 0 refills | Status: AC

## 2023-10-23 NOTE — Patient Instructions (Signed)
 You have lightheaded episodes that are brief that suggest possible vertigo but they sound more like a little bit of dehydration to me.  You appear to be a little dry on exam today and your exam otherwise today is normal  Try to drink 80 to 100 ounces of water daily  Limit or avoid salty foods or soda  Monitor your blood pressures fairly regularly such as 3 to 4 days/week.  The goal is around 120/70.  Low blood pressure would be anything under 110/64.  Since you are on a couple different things for blood pressure, if your pressures are running lower than normal and continued to feel lightheaded then we may need to back off some of your medication.  You also show about a 8 or 9 pound weight loss since her last visit.  If you lose any additional weight in the next 2 to 3 weeks then we would need to possibly lower the dose to some of your blood pressure medication  I recommend you call back in about 2 weeks to let us  know if the dizziness lightheaded went away, and also let us  know what your weight and blood pressure is at that time  In the meantime you can try a low-dose meclizine in the event this is some vertigo.  You can use this twice a day over the weekend.

## 2023-10-23 NOTE — Progress Notes (Signed)
 Subjective:  Cameron Schmitt is a 63 y.o. male who presents for Chief Complaint  Patient presents with   Acute Visit    Having Lightheadedness, going from sitting to standing, bending out, cough, sneezing, anything that has to do with exertion. This has been going on for about a week. Has got some better since the beginning of the week but still doesn't feel 100%. No chest pain     Here for lightheadedness for a week.  No chest pain, no palpitations, no edema, no illness, no sore throat fever, runny nose or congestion, no slurred speech, no confusion, no fall, no numbness or tingling, no weakness.  The lightheadedness is brief lasting for seconds.  Worse when standing or bending over.  Otherwise in normal state of health.  He feels that he hydrates well but he drinks other things besides water.  No other aggravating or relieving factors.    No other c/o.  Past Medical History:  Diagnosis Date   Hypertension    Inguinal cyst    Leukopenia    Pulmonary embolism (HCC) 09/11/2003   Current Outpatient Medications on File Prior to Visit  Medication Sig Dispense Refill   amLODipine  (NORVASC ) 5 MG tablet TAKE 1 TABLET(5 MG) BY MOUTH DAILY 90 tablet 1   aspirin 81 MG tablet Take 81 mg by mouth daily.     ferrous sulfate 325 (65 FE) MG tablet Take 325 mg by mouth daily with breakfast.     lisinopril -hydrochlorothiazide  (ZESTORETIC ) 20-12.5 MG tablet Take 2 tablets by mouth daily. 180 tablet 0   No current facility-administered medications on file prior to visit.    The following portions of the patient's history were reviewed and updated as appropriate: allergies, current medications, past family history, past medical history, past social history, past surgical history and problem list.  ROS Otherwise as in subjective above    Objective: Temp 97.7 F (36.5 C)   Ht 5\' 8"  (1.727 m)   Wt 150 lb 9.6 oz (68.3 kg)   SpO2 98%   BMI 22.90 kg/m   Wt Readings from Last 3 Encounters:   10/23/23 150 lb 9.6 oz (68.3 kg)  09/23/23 162 lb 9.6 oz (73.8 kg)  12/18/22 161 lb 9.6 oz (73.3 kg)   BP Readings from Last 3 Encounters:  09/23/23 128/80  12/18/22 118/66  06/19/22 134/82    General appearance: alert, no distress, well developed, well nourished HEENT: normocephalic, sclerae anicteric, conjunctiva pink and moist, TMs pearly, nares patent, no discharge or erythema, pharynx normal Oral cavity: MMM, no lesions Neck: supple, no lymphadenopathy, no thyromegaly, no masses, no JVD or bruits Heart: RRR, normal S1, S2, no murmurs Lungs: CTA bilaterally, no wheezes, rhonchi, or rales Pulses: 2+ radial pulses, 2+ pedal pulses, normal cap refill Ext: no edema   Assessment: Encounter Diagnoses  Name Primary?   Lightheaded Yes   Dehydration    Essential hypertension      Plan: Of note recent blood work last month showed no significant anemia or electrolyte disturbance.  I reviewed his last EKG from 2023  You have lightheaded episodes that are brief that suggest possible vertigo but they sound more like a little bit of dehydration to me.  You appear to be a little dry on exam today and your exam otherwise today is normal  Try to drink 80 to 100 ounces of water daily  Limit or avoid salty foods or soda  Monitor your blood pressures fairly regularly such as 3 to 4  days/week.  The goal is around 120/70.  Low blood pressure would be anything under 110/64.  Since you are on a couple different things for blood pressure, if your pressures are running lower than normal and continued to feel lightheaded then we may need to back off some of your medication.  You also show about a 8 or 9 pound weight loss since her last visit.  If you lose any additional weight in the next 2 to 3 weeks then we would need to possibly lower the dose to some of your blood pressure medication  I recommend you call back in about 2 weeks to let us  know if the dizziness lightheaded went away, and also  let us  know what your weight and blood pressure is at that time  In the meantime you can try a low-dose meclizine in the event this is some vertigo.  You can use this twice a day over the weekend.   Cameron Schmitt was seen today for acute visit.  Diagnoses and all orders for this visit:  Lightheaded  Dehydration  Essential hypertension  Other orders -     meclizine (ANTIVERT) 12.5 MG tablet; Take 1 tablet (12.5 mg total) by mouth 2 (two) times daily.    Follow up: call report next week

## 2023-10-27 ENCOUNTER — Ambulatory Visit: Admitting: Family Medicine

## 2023-12-30 ENCOUNTER — Other Ambulatory Visit: Payer: Self-pay | Admitting: Family Medicine

## 2023-12-30 DIAGNOSIS — I10 Essential (primary) hypertension: Secondary | ICD-10-CM

## 2024-02-12 ENCOUNTER — Encounter: Payer: Self-pay | Admitting: Family Medicine

## 2024-03-15 ENCOUNTER — Other Ambulatory Visit: Payer: Self-pay | Admitting: Family Medicine

## 2024-03-15 DIAGNOSIS — I10 Essential (primary) hypertension: Secondary | ICD-10-CM
